# Patient Record
Sex: Male | Born: 1990 | Race: Black or African American | Hispanic: No | Marital: Single | Smoking: Former smoker
Health system: Southern US, Community
[De-identification: ages and names within clinical notes are randomized; demographics above are authoritative.]

## PROBLEM LIST (undated history)

## (undated) DIAGNOSIS — A039 Shigellosis, unspecified: Secondary | ICD-10-CM

## (undated) DIAGNOSIS — B20 Human immunodeficiency virus [HIV] disease: Secondary | ICD-10-CM

## (undated) DIAGNOSIS — F445 Conversion disorder with seizures or convulsions: Secondary | ICD-10-CM

## (undated) DIAGNOSIS — Z21 Asymptomatic human immunodeficiency virus [HIV] infection status: Secondary | ICD-10-CM

## (undated) DIAGNOSIS — F419 Anxiety disorder, unspecified: Secondary | ICD-10-CM

## (undated) HISTORY — DX: Human immunodeficiency virus (HIV) disease: B20

## (undated) HISTORY — DX: Asymptomatic human immunodeficiency virus (hiv) infection status: Z21

---
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
## 2013-02-07 ENCOUNTER — Emergency Department (HOSPITAL_COMMUNITY)
Admission: EM | Admit: 2013-02-07 | Discharge: 2013-02-07 | Disposition: A | Discharge status: Home / Self Care | Payer: BC Managed Care – PPO | Attending: Emergency Medicine | Admitting: Emergency Medicine

## 2013-02-07 ENCOUNTER — Encounter (HOSPITAL_COMMUNITY): Payer: Self-pay | Admitting: Emergency Medicine

## 2013-02-07 DIAGNOSIS — J069 Acute upper respiratory infection, unspecified: Secondary | ICD-10-CM | POA: Insufficient documentation

## 2013-02-07 DIAGNOSIS — F411 Generalized anxiety disorder: Secondary | ICD-10-CM | POA: Insufficient documentation

## 2013-02-07 DIAGNOSIS — Z8669 Personal history of other diseases of the nervous system and sense organs: Secondary | ICD-10-CM | POA: Insufficient documentation

## 2013-02-07 DIAGNOSIS — Z79899 Other long term (current) drug therapy: Secondary | ICD-10-CM | POA: Insufficient documentation

## 2013-02-07 DIAGNOSIS — H9209 Otalgia, unspecified ear: Secondary | ICD-10-CM | POA: Insufficient documentation

## 2013-02-07 DIAGNOSIS — F449 Dissociative and conversion disorder, unspecified: Secondary | ICD-10-CM | POA: Insufficient documentation

## 2013-02-07 NOTE — ED Provider Notes (Signed)
CSN: [id number]     Arrival date & time 02/07/13  1636 History  This chart was scribed for [name] Morn, NP working with Enid Skeens, MD by Carl Best, ED Scribe. This patient was seen in room TR07C/TR07C and the patient's care was started at 5:26 PM.     Chief Complaint  Patient presents with  . Sore Throat  . URI    Patient is a 22 y.o. male presenting with pharyngitis and URI. The history is provided by the patient. No language interpreter was used.  Sore Throat  URI Presenting symptoms: ear pain and sore throat   Presenting symptoms: no fever    HPI Comments: [name] [name] is a 22 y.o. male who presents to the Emergency Department complaining of constant sinus drainage, otalgia, and sore throat that started several days ago.  He denies fever, nausea, and emesis as associated symptoms.  He states that he has a history of strep throat.  The patient states that he took two doses of Dayquil yesterday with no relief.  The patient denies having a PCP but has insurance.    Past Medical History  Diagnosis Date  . Migraine   . Anxiety   . Psychiatric pseudoseizure    History reviewed. No pertinent past surgical history. History reviewed. No pertinent family history. History  Substance Use Topics  . Smoking status: Never Smoker   . Smokeless tobacco: Not on file  . Alcohol Use: No    Review of Systems  Constitutional: Negative for fever.  HENT: Positive for ear pain, postnasal drip and sore throat.   Gastrointestinal: Negative for nausea and vomiting.  All other systems reviewed and are negative.    Allergies  Review of patient's allergies indicates no known allergies.  Home Medications   Current Outpatient Rx  Name  Route  Sig  Dispense  Refill  . SUMAtriptan (IMITREX) 50 MG tablet   Oral   Take 1 tablet (50 mg total) by mouth every 2 (two) hours as needed. Migraines....   10 tablet   1    Triage Vitals: BP 134/96  Pulse 93  Temp(Src) 98.4 F (36.9 C) (Oral)   Resp 16  Wt 168 lb 14.4 oz (76.613 kg)  SpO2 99%  Physical Exam  Nursing note and vitals reviewed. Constitutional: He is oriented to person, place, and time. He appears well-developed and well-nourished. No distress.  HENT:  Head: Normocephalic and atraumatic.  Postnasal drip, oropharyngeal erythema, no tonsillar swelling or exudate  Eyes: EOM are normal.  Neck: Neck supple. No tracheal deviation present.  Cardiovascular: Normal rate.   Pulmonary/Chest: Effort normal. No respiratory distress.  Musculoskeletal: Normal range of motion.  Neurological: He is alert and oriented to person, place, and time.  Skin: Skin is warm and dry.  Psychiatric: He has a normal mood and affect. His behavior is normal.    ED Course  Procedures (including critical care time)  DIAGNOSTIC STUDIES: Oxygen Saturation is 99% on room air, normal by my interpretation.    COORDINATION OF CARE: 5:26 PM- Discussed the strep stain results with the patient which revealed a negative result.  Discussed waiting 48 hours for the strep culture results and advised the patient to obtain OTC medication and Ibuprofen or Tylenol to alleviate his symptoms.  Also advised the patient to contact his insurance company to obtain a PCP.  The patient agreed to the treatment plan.     Labs Review Labs Reviewed  RAPID STREP SCREEN  CULTURE, GROUP A  STREP   Imaging Review No results found.  EKG Interpretation   None       MDM   URI.   I personally performed the services described in this documentation, which was scribed in my presence. The recorded information has been reviewed and is accurate.    Jimmye Norman, NP 02/07/13 [phone]

## 2013-02-07 NOTE — ED Notes (Signed)
Productive cough, yellow sputum. Nasal congestion and sore throat since Sunday. "I usually get strep this time of year". No exudate noted in throat.

## 2013-02-07 NOTE — ED Notes (Signed)
Pt reports having URI, congestion, sore throat and runny nose for several days. Denies any fever. No acute distress noted.

## 2013-02-08 NOTE — ED Provider Notes (Signed)
Medical screening examination/treatment/procedure(s) were performed by non-physician practitioner and as supervising physician I was immediately available for consultation/collaboration.   Enid Skeens, MD 02/08/13 [phone]

## 2013-02-09 LAB — CULTURE, GROUP A STREP

## 2013-11-19 ENCOUNTER — Emergency Department (HOSPITAL_COMMUNITY): Payer: BC Managed Care – PPO

## 2013-11-19 ENCOUNTER — Inpatient Hospital Stay (HOSPITAL_COMMUNITY)
Admission: EM | Admit: 2013-11-19 | Discharge: 2013-11-21 | DRG: 977 | Disposition: A | Discharge status: Home / Self Care | Payer: BC Managed Care – PPO | Attending: Internal Medicine | Admitting: Internal Medicine

## 2013-11-19 ENCOUNTER — Encounter (HOSPITAL_COMMUNITY): Payer: Self-pay | Admitting: Emergency Medicine

## 2013-11-19 DIAGNOSIS — R509 Fever, unspecified: Secondary | ICD-10-CM | POA: Diagnosis present

## 2013-11-19 DIAGNOSIS — B20 Human immunodeficiency virus [HIV] disease: Principal | ICD-10-CM | POA: Diagnosis present

## 2013-11-19 DIAGNOSIS — F411 Generalized anxiety disorder: Secondary | ICD-10-CM | POA: Diagnosis present

## 2013-11-19 DIAGNOSIS — F17290 Nicotine dependence, other tobacco product, uncomplicated: Secondary | ICD-10-CM | POA: Diagnosis present

## 2013-11-19 DIAGNOSIS — M79609 Pain in unspecified limb: Secondary | ICD-10-CM | POA: Diagnosis present

## 2013-11-19 DIAGNOSIS — A419 Sepsis, unspecified organism: Secondary | ICD-10-CM

## 2013-11-19 DIAGNOSIS — F419 Anxiety disorder, unspecified: Secondary | ICD-10-CM | POA: Diagnosis present

## 2013-11-19 DIAGNOSIS — G43909 Migraine, unspecified, not intractable, without status migrainosus: Secondary | ICD-10-CM

## 2013-11-19 DIAGNOSIS — F445 Conversion disorder with seizures or convulsions: Secondary | ICD-10-CM | POA: Diagnosis present

## 2013-11-19 DIAGNOSIS — Z8669 Personal history of other diseases of the nervous system and sense organs: Secondary | ICD-10-CM

## 2013-11-19 DIAGNOSIS — D696 Thrombocytopenia, unspecified: Secondary | ICD-10-CM | POA: Diagnosis present

## 2013-11-19 DIAGNOSIS — R569 Unspecified convulsions: Secondary | ICD-10-CM | POA: Diagnosis present

## 2013-11-19 DIAGNOSIS — A039 Shigellosis, unspecified: Secondary | ICD-10-CM | POA: Diagnosis present

## 2013-11-19 DIAGNOSIS — F17200 Nicotine dependence, unspecified, uncomplicated: Secondary | ICD-10-CM | POA: Diagnosis present

## 2013-11-19 HISTORY — DX: Shigellosis, unspecified: A03.9

## 2013-11-19 LAB — CSF CELL COUNT WITH DIFFERENTIAL
RBC Count, CSF: 1 /mm3 — ABNORMAL HIGH
RBC Count, CSF: 128 /mm3 — ABNORMAL HIGH
Tube #: 1
Tube #: 4
WBC CSF: 3 /mm3 (ref 0–5)
WBC, CSF: 4 /mm3 (ref 0–5)

## 2013-11-19 LAB — COMPREHENSIVE METABOLIC PANEL
ALT: 21 U/L (ref 0–53)
AST: 29 U/L (ref 0–37)
Albumin: 4.1 g/dL (ref 3.5–5.2)
Alkaline Phosphatase: 70 U/L (ref 39–117)
Anion gap: 16 — ABNORMAL HIGH (ref 5–15)
BUN: 8 mg/dL (ref 6–23)
CHLORIDE: 99 meq/L (ref 96–112)
CO2: 21 mEq/L (ref 19–32)
Calcium: 9.4 mg/dL (ref 8.4–10.5)
Creatinine, Ser: 1.06 mg/dL (ref 0.50–1.35)
GFR calc Af Amer: >90 mL/min (ref >90)
GFR calc non Af Amer: >90 mL/min (ref >90)
Glucose, Bld: 106 mg/dL — ABNORMAL HIGH (ref 70–99)
Potassium: 4.4 mEq/L (ref 3.7–5.3)
SODIUM: 136 meq/L — AB (ref 137–147)
TOTAL PROTEIN: 8.2 g/dL (ref 6.0–8.3)
Total Bilirubin: 0.8 mg/dL (ref 0.3–1.2)

## 2013-11-19 LAB — CBC WITH DIFFERENTIAL/PLATELET
BASOS PCT: 0 % (ref 0–1)
Basophils Absolute: 0 10*3/uL (ref 0.0–0.1)
EOS ABS: 0 10*3/uL (ref 0.0–0.7)
Eosinophils Relative: 0 % (ref 0–5)
HCT: 43.1 % (ref 39.0–52.0)
Hemoglobin: 14.9 g/dL (ref 13.0–17.0)
Lymphocytes Relative: 10 % — ABNORMAL LOW (ref 12–46)
Lymphs Abs: 1.2 10*3/uL (ref 0.7–4.0)
MCH: 31.9 pg (ref 26.0–34.0)
MCHC: 34.6 g/dL (ref 30.0–36.0)
MCV: 92.3 fL (ref 78.0–100.0)
Monocytes Absolute: 1.1 10*3/uL — ABNORMAL HIGH (ref 0.1–1.0)
Monocytes Relative: 9 % (ref 3–12)
Neutro Abs: 9.8 10*3/uL — ABNORMAL HIGH (ref 1.7–7.7)
Neutrophils Relative %: 82 % — ABNORMAL HIGH (ref 43–77)
PLATELETS: 102 10*3/uL — AB (ref 150–400)
RBC: 4.67 MIL/uL (ref 4.22–5.81)
RDW: 12.4 % (ref 11.5–15.5)
WBC: 12 10*3/uL — ABNORMAL HIGH (ref 4.0–10.5)

## 2013-11-19 LAB — RAPID URINE DRUG SCREEN, HOSP PERFORMED
Amphetamines: NOT DETECTED
BARBITURATES: NOT DETECTED
Benzodiazepines: NOT DETECTED
Cocaine: NOT DETECTED
Opiates: NOT DETECTED
TETRAHYDROCANNABINOL: NOT DETECTED

## 2013-11-19 LAB — RAPID STREP SCREEN (MED CTR MEBANE ONLY): STREPTOCOCCUS, GROUP A SCREEN (DIRECT): NEGATIVE

## 2013-11-19 LAB — CREATININE, SERUM
Creatinine, Ser: 0.92 mg/dL (ref 0.50–1.35)
GFR calc Af Amer: >90 mL/min (ref >90)
GFR calc non Af Amer: >90 mL/min (ref >90)

## 2013-11-19 LAB — URINALYSIS, ROUTINE W REFLEX MICROSCOPIC
Bilirubin Urine: NEGATIVE
Glucose, UA: NEGATIVE mg/dL
Ketones, ur: NEGATIVE mg/dL
Leukocytes, UA: NEGATIVE
Nitrite: NEGATIVE
PH: 7.5 (ref 5.0–8.0)
Protein, ur: NEGATIVE mg/dL
SPECIFIC GRAVITY, URINE: 1.01 (ref 1.005–1.030)
UROBILINOGEN UA: 0.2 mg/dL (ref 0.0–1.0)

## 2013-11-19 LAB — GRAM STAIN

## 2013-11-19 LAB — CBC
HCT: 42.3 % (ref 39.0–52.0)
HEMOGLOBIN: 14.4 g/dL (ref 13.0–17.0)
MCH: 32.4 pg (ref 26.0–34.0)
MCHC: 34 g/dL (ref 30.0–36.0)
MCV: 95.3 fL (ref 78.0–100.0)
Platelets: 105 10*3/uL — ABNORMAL LOW (ref 150–400)
RBC: 4.44 MIL/uL (ref 4.22–5.81)
RDW: 12.6 % (ref 11.5–15.5)
WBC: 11.5 10*3/uL — ABNORMAL HIGH (ref 4.0–10.5)

## 2013-11-19 LAB — HEMOGLOBIN A1C
Hgb A1c MFr Bld: 5.1 % (ref <5.7)
Mean Plasma Glucose: 100 mg/dL (ref <117)

## 2013-11-19 LAB — PROTEIN, CSF: TOTAL PROTEIN, CSF: 14 mg/dL — AB (ref 15–45)

## 2013-11-19 LAB — INFLUENZA PANEL BY PCR (TYPE A & B)
H1N1FLUPCR: NOT DETECTED
Influenza A By PCR: NEGATIVE
Influenza B By PCR: NEGATIVE

## 2013-11-19 LAB — RPR

## 2013-11-19 LAB — HIV 1/2 CONFIRMATION
HIV 1 ANTIBODY: POSITIVE — AB
HIV 2 AB: NEGATIVE

## 2013-11-19 LAB — CRYPTOCOCCAL ANTIGEN, CSF: Crypto Ag: NEGATIVE

## 2013-11-19 LAB — TSH: TSH: 0.58 u[IU]/mL (ref 0.350–4.500)

## 2013-11-19 LAB — GLUCOSE, CSF: Glucose, CSF: 79 mg/dL — ABNORMAL HIGH (ref 43–76)

## 2013-11-19 LAB — URINE MICROSCOPIC-ADD ON

## 2013-11-19 LAB — HIV ANTIBODY (ROUTINE TESTING W REFLEX): HIV 1&2 Ab, 4th Generation: REACTIVE — AB

## 2013-11-19 LAB — I-STAT CG4 LACTIC ACID, ED
Lactic Acid, Venous: 3.2 mmol/L — ABNORMAL HIGH (ref 0.5–2.2)
Lactic Acid, Venous: 1.12 mmol/L (ref 0.5–2.2)

## 2013-11-19 MED ORDER — DEXAMETHASONE SODIUM PHOSPHATE 4 MG/ML IJ SOLN
10.0000 mg | Freq: Once | INTRAMUSCULAR | Status: AC
  Administered 2013-11-19: 10 mg via INTRAVENOUS
  Filled 2013-11-19: qty 3

## 2013-11-19 MED ORDER — DEXTROSE 5 % IV SOLN
2.0000 g | Freq: Once | INTRAVENOUS | Status: AC
  Administered 2013-11-19: 2 g via INTRAVENOUS
  Filled 2013-11-19: qty 2

## 2013-11-19 MED ORDER — SODIUM CHLORIDE 0.9 % IV BOLUS (SEPSIS)
1000.0000 mL | Freq: Once | INTRAVENOUS | Status: AC
  Administered 2013-11-19: 1000 mL via INTRAVENOUS

## 2013-11-19 MED ORDER — SODIUM CHLORIDE 0.9 % IJ SOLN
3.0000 mL | Freq: Two times a day (BID) | INTRAMUSCULAR | Status: DC
  Administered 2013-11-20 – 2013-11-21 (×2): 3 mL via INTRAVENOUS

## 2013-11-19 MED ORDER — DEXAMETHASONE SODIUM PHOSPHATE 10 MG/ML IJ SOLN: 10.0000 mg | Freq: Once | INTRAMUSCULAR | Status: DC

## 2013-11-19 MED ORDER — ACETAMINOPHEN 650 MG RE SUPP: 650.0000 mg | Freq: Four times a day (QID) | RECTAL | Status: DC | PRN

## 2013-11-19 MED ORDER — HYDROMORPHONE HCL PF 1 MG/ML IJ SOLN
1.0000 mg | Freq: Once | INTRAMUSCULAR | Status: DC
  Filled 2013-11-19: qty 1

## 2013-11-19 MED ORDER — DEXTROSE 5 % IV SOLN
2.0000 g | Freq: Two times a day (BID) | INTRAVENOUS | Status: DC
  Administered 2013-11-20 (×3): 2 g via INTRAVENOUS
  Filled 2013-11-19 (×6): qty 2

## 2013-11-19 MED ORDER — HYDROCODONE-ACETAMINOPHEN 5-325 MG PO TABS
1.0000 | ORAL_TABLET | ORAL | Status: DC | PRN
  Administered 2013-11-19 – 2013-11-21 (×3): 2 via ORAL
  Filled 2013-11-19 (×4): qty 2

## 2013-11-19 MED ORDER — SODIUM CHLORIDE 0.9 % IV SOLN
2.0000 g | Freq: Once | INTRAVENOUS | Status: AC
  Administered 2013-11-19: 2 g via INTRAVENOUS
  Filled 2013-11-19: qty 2000

## 2013-11-19 MED ORDER — ONDANSETRON HCL 4 MG/2ML IJ SOLN
4.0000 mg | Freq: Four times a day (QID) | INTRAMUSCULAR | Status: DC | PRN
Start: 2013-11-19 — End: 2013-11-21

## 2013-11-19 MED ORDER — VANCOMYCIN HCL IN DEXTROSE 1-5 GM/200ML-% IV SOLN: 1000.0000 mg | Freq: Once | INTRAVENOUS | Status: DC

## 2013-11-19 MED ORDER — DEXTROSE 5 % IV SOLN
10.0000 mg/kg | Freq: Three times a day (TID) | INTRAVENOUS | Status: DC
  Administered 2013-11-19 – 2013-11-20 (×2): 725 mg via INTRAVENOUS
  Filled 2013-11-19 (×4): qty 14.5

## 2013-11-19 MED ORDER — SODIUM CHLORIDE 0.9 % IV SOLN
2.0000 g | INTRAVENOUS | Status: DC
  Administered 2013-11-19: 2 g via INTRAVENOUS
  Filled 2013-11-19 (×6): qty 2000

## 2013-11-19 MED ORDER — ENOXAPARIN SODIUM 40 MG/0.4ML ~~LOC~~ SOLN
40.0000 mg | SUBCUTANEOUS | Status: DC
Start: 2013-11-19 — End: 2013-11-21
  Administered 2013-11-19: 40 mg via SUBCUTANEOUS
  Filled 2013-11-19 (×2): qty 0.4

## 2013-11-19 MED ORDER — ONDANSETRON HCL 4 MG PO TABS: 4.0000 mg | ORAL_TABLET | Freq: Four times a day (QID) | ORAL | Status: DC | PRN

## 2013-11-19 MED ORDER — DEXTROSE 5 % IV SOLN
10.0000 mg/kg | Freq: Once | INTRAVENOUS | Status: AC
  Administered 2013-11-19: 725 mg via INTRAVENOUS
  Filled 2013-11-19: qty 14.5

## 2013-11-19 MED ORDER — VANCOMYCIN HCL IN DEXTROSE 1-5 GM/200ML-% IV SOLN
1000.0000 mg | Freq: Three times a day (TID) | INTRAVENOUS | Status: DC
  Administered 2013-11-19 – 2013-11-21 (×5): 1000 mg via INTRAVENOUS
  Filled 2013-11-19 (×9): qty 200

## 2013-11-19 MED ORDER — ACETAMINOPHEN 325 MG PO TABS
650.0000 mg | ORAL_TABLET | Freq: Once | ORAL | Status: AC
  Administered 2013-11-19: 650 mg via ORAL
  Filled 2013-11-19: qty 2

## 2013-11-19 MED ORDER — SODIUM CHLORIDE 0.9 % IV SOLN
1000.0000 mL | INTRAVENOUS | Status: DC
  Administered 2013-11-19 – 2013-11-20 (×3): 1000 mL via INTRAVENOUS

## 2013-11-19 MED ORDER — LORAZEPAM 2 MG/ML IJ SOLN
1.0000 mg | Freq: Once | INTRAMUSCULAR | Status: AC
  Administered 2013-11-19: 1 mg via INTRAVENOUS
  Filled 2013-11-19: qty 1

## 2013-11-19 MED ORDER — SODIUM CHLORIDE 0.9 % IV BOLUS (SEPSIS)
30.0000 mL/kg | Freq: Once | INTRAVENOUS | Status: AC
  Administered 2013-11-19: 2178 mL via INTRAVENOUS

## 2013-11-19 MED ORDER — VANCOMYCIN HCL 10 G IV SOLR
1500.0000 mg | Freq: Once | INTRAVENOUS | Status: AC
  Administered 2013-11-19: 1500 mg via INTRAVENOUS
  Filled 2013-11-19: qty 1500

## 2013-11-19 MED ORDER — ACETAMINOPHEN 325 MG PO TABS: 650.0000 mg | ORAL_TABLET | Freq: Four times a day (QID) | ORAL | Status: DC | PRN

## 2013-11-19 NOTE — ED Notes (Signed)
Lactic Acid 1.12 mmol/L given to PA Piepenbrink

## 2013-11-19 NOTE — ED Notes (Signed)
PA at bedside starting the LP.

## 2013-11-19 NOTE — ED Notes (Signed)
LP procedure beginning. Time out completed.

## 2013-11-19 NOTE — Progress Notes (Signed)
[name] [name] Kress [id number] Admission Data: 11/19/2013 4:54 PM Attending Provider: Aletta EdouardShilpa Bhardwaj, MD  PCP:No PCP Per Patient Consults/ Treatment Team:    [name] [name] [name] [name] is a 23 y.o. male patient admitted from ED awake, alert  & orientated  X 3,  Full Code, VSS - Blood pressure 104/52, pulse 120, temperature 100 F (37.8 C), temperature source Oral, resp. rate 22, height 5\' 7"  (1.702 m), weight 80.559 kg (177 lb 9.6 oz), SpO2 100.00%., no c/o shortness of breath, no c/o chest pain, no distress noted. Tele # 06 placed.  IV site WDL: Right A/C running NS.   Allergies:  No Known Allergies   Past Medical History  Diagnosis Date  . Migraine   . Anxiety   . Psychiatric pseudoseizure     Pt orientation to unit, room and routine. Information packet given to patient/family.  Admission INP armband ID verified with patient/family, and in place. SR up x 2, fall risk assessment complete with Patient and family verbalizing understanding of risks associated with falls. Pt verbalizes an understanding of how to use the call bell and to call for help before getting out of bed.  Skin, clean-dry- intact without evidence of bruising, or skin tears.   No evidence of skin break down noted on exam.    Will cont to monitor and assist as needed.  Kern ReapBrumagin, Sultan Pargas L, RN 11/19/2013 4:54 PM

## 2013-11-19 NOTE — ED Notes (Signed)
Pt in CT.

## 2013-11-19 NOTE — H&P (Signed)
Date: 11/19/2013               [name] [name] Name:  [name] [name] MRN: [id number]  DOB: [birth year] Age / Sex: 23 y.o., male   PCP: No Pcp Per [name] [name]         Medical Service: Internal Medicine Teaching Service         Attending Physician: Dr. Aletta Edouard, MD    First Contact: Camila Li Pager: [phone]  Second Contact: Dr. Christen Bame Pager: [phone]   Third Contact: Dr. Rich Number Pager: [phone]  After Hours (After 5p/  First Contact Pager: [phone]  weekends / holidays): Second Contact Pager: [phone]   Chief Complaint: Nausea, vomiting, diarrhea, leg pain and weakness  History of Present Illness: [name] [name] is a 23yo man with PMHx of migraines, pseudoseizures, and anxiety who presents to the ED with nausea, vomiting, diarrhea and severe leg pain and weakness that started last night. [name] [name] has chronic mild baseline leg weakness and pain but says over the past few days it has increased significantly. Last night [name] [name] reports having a subjective fever with shaking and chills. He had severe leg pain from his hips down to his feet.  [name] [name] describes having "too many BMs to count" last night and feeling so bad he had to "sleep in the bathroom". He has not taken any over the counter medications for his symptoms.  His nausea has worsened today and he had one episode of emesis in the ambulance on his way to the hospital. He currently endorses having headaches that are throbbing at times and similar to migraines he has had in the past. He endorses having nuchal rigidity that is worse today than yesterday.  [name] [name] denies any recent travel, new rashes, illness, or spending time in the woods. 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 He is sexually active with both males and females and states that he always uses protection.   On presentation to the ED, the [name] [name] had a fever of 102 with tachycardia and tachypnea. His BP was in the 80s/40s, he had a WBC of 12, lactate  of 3.2 and nuchal rigidity. He received 2L bolus of NS and was started on empiric vancomycin, acyclovir, rocephin, and ampicillin. He also received 1 times does of dexamethasone. Lumbar puncture was performed showing clear fluid with protein of 14, glucose of 79, occasional leukocytes, and negative for gram stain/Cryptococcal antigens. Blood cultures were drawn. Chest x-ray was normal with CT showing multilevel paranasal sinus disease, but otherwise normal.   Meds: Current Facility-Administered Medications  Medication Dose Route Frequency Provider Last Rate Last Dose  . 0.9 %  sodium chloride infusion  1,000 mL Intravenous Continuous Jennifer L Piepenbrink, PA-C 125 mL/hr at 11/19/13 1152 1,000 mL at 11/19/13 1152  . acetaminophen (TYLENOL) tablet 650 mg  650 mg Oral Q6H PRN Christen Bame, MD       Or  . acetaminophen (TYLENOL) suppository 650 mg  650 mg Rectal Q6H PRN Christen Bame, MD      . acyclovir (ZOVIRAX) 725 mg in dextrose 5 % 150 mL IVPB  10 mg/kg Intravenous 3 times per day Renaee Munda, RPH      . cefTRIAXone (ROCEPHIN) 2 g in dextrose 5 % 50 mL IVPB  2 g Intravenous Q12H Kendra P Hiatt, RPH      . enoxaparin (LOVENOX) injection 40 mg  40 mg Subcutaneous Q24H Christen Bame, MD      .  HYDROcodone-acetaminophen (NORCO/VICODIN) 5-325 MG per tablet 1-2 tablet  1-2 tablet Oral Q4H PRN Christen Bame, MD   2 tablet at 11/19/13 1710  . HYDROmorphone (DILAUDID) injection 1 mg  1 mg Intravenous Once Jennifer L Piepenbrink, PA-C      . ondansetron (ZOFRAN) tablet 4 mg  4 mg Oral Q6H PRN Christen Bame, MD       Or  . ondansetron [facility]) injection 4 mg  4 mg Intravenous Q6H PRN Christen Bame, MD      . sodium chloride 0.9 % injection 3 mL  3 mL Intravenous Q12H Christen Bame, MD      . vancomycin (VANCOCIN) IVPB 1000 mg/200 mL premix  1,000 mg Intravenous Q8H Kendra P Hiatt, RPH        Allergies: Allergies as of 11/19/2013  . (No Known Allergies)   Past Medical History  Diagnosis Date  . Migraine   .  Anxiety   . Psychiatric pseudoseizure    History reviewed. No pertinent past surgical history. No family history on file. History   Social History  . Marital Status: Single    Spouse Name: N/A    Number of Children: N/A  . Years of Education: N/A   Occupational History  . Not on file.   Social History Main Topics  . Smoking status: Never Smoker   . Smokeless tobacco: Not on file  . Alcohol Use: Yes  . Drug Use: Not on file  . Sexual Activity: Not on file   Other Topics Concern  . Not on file   Social History Narrative   [name] [name] lives with a roommate in [place] where he attends [employer] and is currently enrolled in summer school. He is originally from [place]. He is sexual active with both men and women and does use condoms for protection. He endorses having an occasional glass of whine (no more than a couple drinks a week). He smokes a cigar 2-3 times a week but denies smoking cigarettes. He has smoked marijuana in the past but non recently.     Review of Systems: General: Denies recent weight loss HEENT: Reports headaches as above. No ear pain, rhinorrhea, sore throat CV: Denies palpitations, chest pain Pulm: Reports mild shortness of breath. Denies wheezing coughing GI: No constipation, melena or hematochezia GU: Denies hematuria, frequency, urgency MSK: Denis joint pains Skin: Denies recent rashes or bruises   Physical Exam: Blood pressure 104/52, pulse 120, temperature 100 F (37.8 C), temperature source Oral, resp. rate 22, height 5\' 7"  (1.702 m), weight 177 lb 9.6 oz (80.559 kg), SpO2 100.00%. General: Sick appearing, lying in bed HEENT: [place]/AT, PERRLA, moist mucus membranes, shortened frenulum, nuchal rigidity Lungs: Clear to auscultation bilaterally, breaths nonlabored on 2L O2 via [place] Heart: Tachycardia with regular rhythm, normal S1 and S2 with no murmurs Abdomen: +BS, non-tender, non-distended Extremities: Pain to palpation of quadriceps bilaterally, no  joint warmness/effusion, pulse 2+ bilaterally Skin: No new rashes or ulcers Neurologic: A&Ox3, attentive to conversation, no focal deficits  Lab results: Basic Metabolic Panel:  Recent Labs  16/10/96 1050  NA 136*  K 4.4  CL 99  CO2 21  GLUCOSE 106*  BUN 8  CREATININE 1.06  CALCIUM 9.4   Liver Function Tests:  Recent Labs  11/19/13 1050  AST 29  ALT 21  ALKPHOS 70  BILITOT 0.8  PROT 8.2  ALBUMIN 4.1   No results found for this basename: LIPASE, AMYLASE,  in the last 72 hours No results found for  this basename: AMMONIA,  in the last 72 hours CBC:  Recent Labs  11/19/13 1050 11/19/13 1704  WBC 12.0* 11.5*  NEUTROABS 9.8*  --   HGB 14.9 14.4  HCT 43.1 42.3  MCV 92.3 95.3  PLT 102* 105*   Cardiac Enzymes: No results found for this basename: CKTOTAL, CKMB, CKMBINDEX, TROPONINI,  in the last 72 hours BNP: No results found for this basename: PROBNP,  in the last 72 hours D-Dimer: No results found for this basename: DDIMER,  in the last 72 hours CBG: No results found for this basename: GLUCAP,  in the last 72 hours Hemoglobin A1C: No results found for this basename: HGBA1C,  in the last 72 hours Fasting Lipid Panel: No results found for this basename: CHOL, HDL, LDLCALC, TRIG, CHOLHDL, LDLDIRECT,  in the last 72 hours Thyroid Function Tests: No results found for this basename: TSH, T4TOTAL, FREET4, T3FREE, THYROIDAB,  in the last 72 hours Anemia Panel: No results found for this basename: VITAMINB12, FOLATE, FERRITIN, TIBC, IRON, RETICCTPCT,  in the last 72 hours Coagulation: No results found for this basename: LABPROT, INR,  in the last 72 hours Urine Drug Screen: Drugs of Abuse  No results found for this basename: labopia,  cocainscrnur,  labbenz,  amphetmu,  thcu,  labbarb    Alcohol Level: No results found for this basename: ETH,  in the last 72 hours Urinalysis:  Recent Labs  11/19/13 1358  COLORURINE YELLOW  LABSPEC 1.010  PHURINE 7.5    GLUCOSEU NEGATIVE  HGBUR MODERATE*  BILIRUBINUR NEGATIVE  KETONESUR NEGATIVE  PROTEINUR NEGATIVE  UROBILINOGEN 0.2  NITRITE NEGATIVE  LEUKOCYTESUR NEGATIVE    Imaging results:  Ct Head Wo Contrast  11/19/2013   CLINICAL DATA:  Headache and nuchal rigidity  EXAM: CT HEAD WITHOUT CONTRAST  TECHNIQUE: Contiguous axial images were obtained from the base of the skull through the vertex without intravenous contrast.  COMPARISON:  None.  FINDINGS: The ventricles are normal in size and configuration. There is no mass, hemorrhage, extra-axial fluid collection, or midline shift. Gray-white compartments appear normal. No acute infarct is apparent. No meningeal lesions are identified on this noncontrast enhanced study.  Bony calvarium appears intact. The mastoid air cells are clear. There is mucosal thickening in both maxillary antra as well as in multiple ethmoid air cells, more on the right than on the left. Mild mucosal thickening is also noted in the sphenoid sinus regions. Frontal sinuses are hypoplastic.  IMPRESSION: Multilevel paranasal sinus disease.  Study otherwise unremarkable.   Electronically Signed   By: Bretta Bang M.D.   On: 11/19/2013 11:38   Dg Chest Port 1 View  11/19/2013   CLINICAL DATA:  MIGRAINE LEG PAIN  EXAM: PORTABLE CHEST - 1 VIEW  COMPARISON:  None.  FINDINGS: Relatively low lung volumes. Lungs clear. Heart size upper limits normal. No effusion. Regional bones unremarkable.  IMPRESSION: No active disease.   Electronically Signed   By: Oley Balm M.D.   On: 11/19/2013 11:14    Other results: EKG: Pending  Assessment & Plan: [name] [name] is a 23 yo man with PMH of migraines, anxiety and pseudo seizures who presented with nausea, vomiting, diarrhea, nuchal rigidity and was found to have sepsis consistent with likely viral or bacterial infection.  1. Sepsis concerning for meningitis or other infection- On presentation [name] [name] had a fever of 102.8, was tachycardic to 127  , tachypnic to 27, hypotensive to 80s/40s with a WBC of 12. Lactic acid 3.2. Nuchal rigidity  with associated headache is concerning for meningitis. Lumbar puncture showed normal glucose, low protein, with normal gram stain and was negative for cryptococcal antigen. Viral studies including HSV are still pending. [name] [name] peri-nasal sinus disease could have provided source for infection although [name] [name] denies recent congestion. The differential also includes systemic infection, such as HIV as [name] [name] has multiple sexual partners of both genders. His absolute lymphocyte count of 1.2 is in the low normal range. Sepsis could also be secondary to bacteremia. Urinary source of infection is unlikely given demographic and bland urinalysis except for moderate red blood cells. Gonococcal arthritis is less likely given lack of specific joint pain or rash. - Admit to general floor  - Continue NS at 125 ml/hr - f/u blood cultures, HSV viral culture  - Continue prophylactic vancomycin, acyclovir, Rocephin - d/c ampicillin as already has 3rd generation cephalosporin - f/u CSF viral panel, culture, gram stain (negative) - Zofran PRN for nausea - f/u influenza panel --> negative  - Continuous cardiac monitoring - f/u rapid drug screen - f/u Cryptococcal Ag--> negative - f/u HIV antibody - f/u rapid strep screen --> negative  2. Migraine-History of migraines in the past that were treated by a drug [name] [name] can't remember which was stopped when symptoms became intermittent. Current migraines likely exacerbated by infection.  - Continue to monitor - Norco q 4 hours for pain.   Diet: Regular  DVT prophylaxis: Lovenox 40 mg BID Droplet precautions  Dispo: Disposition is deferred at this time, awaiting improvement of current medical problems. Anticipated discharge in approximately 2-3 day(s).   The [name] [name] does not have a current PCP (No Pcp Per [name] [name]) and [facility] after  discharge.  The [name] [name] does not have transportation limitations that hinder transportation to clinic appointments.  Signed: Rich Numberarly Kobe Jansma, MD 11/19/2013, 5:48 PM

## 2013-11-19 NOTE — ED Notes (Signed)
Pt states his headache is better and he is resting comfortably.

## 2013-11-19 NOTE — ED Notes (Signed)
Patient transported to CT 

## 2013-11-19 NOTE — ED Notes (Signed)
Obtained consent for LP. Pt agreeable to procedure and understands risks/benefits

## 2013-11-19 NOTE — Progress Notes (Signed)
ANTIBIOTIC CONSULT NOTE - INITIAL  Pharmacy Consult for Vancomycin, Rocephin, Ampicillin, Acylocir Indication: R/O meningitis  No Known Allergies  Patient Measurements: Height: 5\' 6"  (167.6 cm) Weight: 160 lb (72.576 kg) IBW/kg (Calculated) : 63.8 Adjusted Body Weight:   Vital Signs: Temp: 102.8 F (39.3 C) (07/26 1028) Temp src: Oral (07/26 1028) BP: 96/40 mmHg (07/26 1232) Pulse Rate: 119 (07/26 1200) Intake/Output from previous day:   Intake/Output from this shift:    Labs:  Recent Labs  11/19/13 1050  WBC 12.0*  HGB 14.9  PLT 102*  CREATININE 1.06   Estimated Creatinine Clearance: 99.5 ml/min (by C-G formula based on Cr of 1.06). No results found for this basename: VANCOTROUGH, VANCOPEAK, VANCORANDOM, GENTTROUGH, GENTPEAK, GENTRANDOM, TOBRATROUGH, TOBRAPEAK, TOBRARND, AMIKACINPEAK, AMIKACINTROU, AMIKACIN,  in the last 72 hours   Microbiology: No results found for this or any previous visit (from the past 720 hour(s)).  Medical History: Past Medical History  Diagnosis Date  . Migraine   . Anxiety   . Psychiatric pseudoseizure     Medications:  Scheduled:  .  HYDROmorphone (DILAUDID) injection  1 mg Intravenous Once   Assessment: 23yo male presenting with c/o migraine and stiff neck, with temperature 102.8.  To start antibiotics for R/O meningitis.  Cr 1.06, LA 3.2, WBC 12.  Ampicillin 2g and Ceftriaxone 2g currently infusing.  Goal of Therapy:  Vancomycin trough level 15-20 mcg/ml treatment of meningitis  Plan:  1-  Vancomycin 1500mg  IV x 1, then 1000mg  IV q8 2-  Acyclovir 10mg /kg IV q8 3-  Rocephin 2g IV q12 4-  Ampicillin 2g IV q4 5-  F/U LP, trend renal fxn and clinical status 6-  Vancomycin trough at steady state  Brooklee Michelin P 11/19/2013,12:33 PM

## 2013-11-19 NOTE — ED Notes (Signed)
LP completed and successful. Pt tolerated well. Pt laying on back from 30 minutes

## 2013-11-19 NOTE — ED Provider Notes (Signed)
CSN: [id number]     Arrival date & time 11/19/13  1022 History   First MD Initiated Contact with Patient 11/19/13 1029     Chief Complaint  Patient presents with  . Migraine  . Leg Pain     (Consider location/radiation/quality/duration/timing/severity/associated sxs/prior Treatment) HPI Comments: Patient is a 23 year old male past medical history significant for migraines, anxiety, pseudoseizures presenting to the emergency department for headache with associated fever, chills, neck pain, fatigue that began last evening. Alleviating factors: none. Aggravating factors: movement of his neck. Medications tried prior to arrival: none. Denies CP, SOB, cough, nausea, vomiting, diarrhea.     Patient is a 23 y.o. male presenting with migraines and leg pain.  Migraine Associated symptoms include chills, fatigue, a fever, headaches and neck pain.  Leg Pain Associated symptoms: fatigue, fever and neck pain     Past Medical History  Diagnosis Date  . Migraine   . Anxiety   . Psychiatric pseudoseizure    History reviewed. No pertinent past surgical history. No family history on file. History  Substance Use Topics  . Smoking status: Never Smoker   . Smokeless tobacco: Not on file  . Alcohol Use: Yes    Review of Systems  Constitutional: Positive for fever, chills and fatigue.  Musculoskeletal: Positive for neck pain and neck stiffness.  Neurological: Positive for headaches.  All other systems reviewed and are negative.     Allergies  Review of patient's allergies indicates no known allergies.  Home Medications   Prior to Admission medications   Not on File   BP 93/53  Pulse 128  Temp(Src) 101 F (38.3 C) (Oral)  Resp 25  Ht 5\' 6"  (1.676 m)  Wt 160 lb (72.576 kg)  BMI 25.84 kg/m2  SpO2 97% Physical Exam  Nursing note and vitals reviewed. Constitutional: He is oriented to person, place, and time. He appears well-developed and well-nourished. He appears ill.  HENT:   Head: Normocephalic and atraumatic.  Right Ear: External ear normal.  Left Ear: External ear normal.  Nose: Nose normal.  Mouth/Throat: Oropharynx is clear and moist. No oropharyngeal exudate.  Eyes: Conjunctivae and EOM are normal. Pupils are equal, round, and reactive to light.  Neck: Muscular tenderness present. Decreased range of motion present. No edema present. Kernig's sign noted. No Brudzinski's sign noted.  Cardiovascular: Regular rhythm and normal heart sounds.  Tachycardia present.   Pulmonary/Chest: Effort normal and breath sounds normal. No respiratory distress.  Abdominal: Soft. There is no tenderness.  Musculoskeletal: He exhibits no edema.  MAE x 4  Neurological: He is alert and oriented to person, place, and time. He has normal strength. No cranial nerve deficit. GCS eye subscore is 4. GCS verbal subscore is 5. GCS motor subscore is 6.  Sensation grossly intact.   Skin: Skin is warm and dry. No rash noted.    ED Course  LUMBAR PUNCTURE Date/Time: 11/19/2013 12:26 PM Performed by: Jeannetta EllisPIEPENBRINK, Miriam Kestler L Authorized by: Jeannetta EllisPIEPENBRINK, Pasty Manninen L Consent: Verbal consent obtained. written consent obtained. Risks and benefits: risks, benefits and alternatives were discussed Consent given by: patient Patient understanding: patient states understanding of the procedure being performed Patient consent: the patient's understanding of the procedure matches consent given Procedure consent: procedure consent matches procedure scheduled Relevant documents: relevant documents present and verified Test results: test results available and properly labeled Imaging studies: imaging studies available Patient identity confirmed: verbally with patient, arm band and hospital-assigned identification number Time out: Immediately prior to procedure a "time out" was  called to verify the correct patient, procedure, equipment, support staff and site/side marked as required. Indications:  evaluation for infection Local anesthetic: lidocaine 2% without epinephrine Anesthetic total: 5 ml Patient sedated: no Preparation: Patient was prepped and draped in the usual sterile fashion. Lumbar space: L3-L4 interspace Patient's position: right lateral decubitus Needle gauge: 18 Needle type: spinal needle - Quincke tip Needle length: 2.5 in Number of attempts: 3 Fluid appearance: clear Tubes of fluid: 4 Total volume: 4 ml Post-procedure: site cleaned, pressure dressing applied and adhesive bandage applied Patient tolerance: Patient tolerated the procedure well with no immediate complications.   (including critical care time) Medications  sodium chloride 0.9 % bolus 2,178 mL (0 mLs Intravenous Stopped 11/19/13 1239)    Followed by  0.9 %  sodium chloride infusion (1,000 mLs Intravenous New Bag/Given 11/19/13 1152)  HYDROmorphone (DILAUDID) injection 1 mg (0 mg Intravenous Hold 11/19/13 1158)  cefTRIAXone (ROCEPHIN) 2 g in dextrose 5 % 50 mL IVPB (not administered)  ampicillin (OMNIPEN) 2 g in sodium chloride 0.9 % 50 mL IVPB (not administered)  acyclovir (ZOVIRAX) 725 mg in dextrose 5 % 150 mL IVPB (not administered)  vancomycin (VANCOCIN) IVPB 1000 mg/200 mL premix (not administered)  cefTRIAXone (ROCEPHIN) 2 g in dextrose 5 % 50 mL IVPB (0 g Intravenous Stopped 11/19/13 1227)  ampicillin (OMNIPEN) 2 g in sodium chloride 0.9 % 50 mL IVPB (0 g Intravenous Stopped 11/19/13 1225)  acetaminophen (TYLENOL) tablet 650 mg (650 mg Oral Given 11/19/13 1106)  vancomycin (VANCOCIN) 1,500 mg in sodium chloride 0.9 % 500 mL IVPB (1,500 mg Intravenous New Bag/Given 11/19/13 1244)  acyclovir (ZOVIRAX) 725 mg in dextrose 5 % 150 mL IVPB (0 mg Intravenous Stopped 11/19/13 1353)  LORazepam (ATIVAN) injection 1 mg (1 mg Intravenous Given 11/19/13 1157)  dexamethasone (DECADRON) injection 10 mg (10 mg Intravenous Given 11/19/13 1154)  sodium chloride 0.9 % bolus 1,000 mL (0 mLs Intravenous Stopped 11/19/13  1353)    Labs Review Labs Reviewed  CBC WITH DIFFERENTIAL - Abnormal; Notable for the following:    WBC 12.0 (*)    Platelets 102 (*)    Neutrophils Relative % 82 (*)    Neutro Abs 9.8 (*)    Lymphocytes Relative 10 (*)    Monocytes Absolute 1.1 (*)    All other components within normal limits  COMPREHENSIVE METABOLIC PANEL - Abnormal; Notable for the following:    Sodium 136 (*)    Glucose, Bld 106 (*)    Anion gap 16 (*)    All other components within normal limits  URINALYSIS, ROUTINE W REFLEX MICROSCOPIC - Abnormal; Notable for the following:    Hgb urine dipstick MODERATE (*)    All other components within normal limits  CSF CELL COUNT WITH DIFFERENTIAL - Abnormal; Notable for the following:    RBC Count, CSF 1 (*)    All other components within normal limits  CSF CELL COUNT WITH DIFFERENTIAL - Abnormal; Notable for the following:    RBC Count, CSF 128 (*)    All other components within normal limits  GLUCOSE, CSF - Abnormal; Notable for the following:    Glucose, CSF 79 (*)    All other components within normal limits  PROTEIN, CSF - Abnormal; Notable for the following:    Total  Protein, CSF 14 (*)    All other components within normal limits  URINE MICROSCOPIC-ADD ON - Abnormal; Notable for the following:    Squamous Epithelial / LPF FEW (*)  All other components within normal limits  I-STAT CG4 LACTIC ACID, ED - Abnormal; Notable for the following:    Lactic Acid, Venous 3.20 (*)    All other components within normal limits  GRAM STAIN  RAPID STREP SCREEN  CULTURE, BLOOD (ROUTINE X 2)  CULTURE, BLOOD (ROUTINE X 2)  URINE CULTURE  CSF CULTURE  HERPES SIMPLEX VIRUS CULTURE  CULTURE, GROUP A STREP  CRYPTOCOCCAL ANTIGEN, CSF  HERPES SIMPLEX VIRUS(HSV) DNA BY PCR  VDRL, CSF  RPR  HIV ANTIBODY (ROUTINE TESTING)  INFLUENZA PANEL BY PCR (TYPE A & B, H1N1)  URINE RAPID DRUG SCREEN (HOSP PERFORMED)  I-STAT CG4 LACTIC ACID, ED    Imaging Review Ct Head Wo  Contrast  11/19/2013   CLINICAL DATA:  Headache and nuchal rigidity  EXAM: CT HEAD WITHOUT CONTRAST  TECHNIQUE: Contiguous axial images were obtained from the base of the skull through the vertex without intravenous contrast.  COMPARISON:  None.  FINDINGS: The ventricles are normal in size and configuration. There is no mass, hemorrhage, extra-axial fluid collection, or midline shift. Gray-white compartments appear normal. No acute infarct is apparent. No meningeal lesions are identified on this noncontrast enhanced study.  Bony calvarium appears intact. The mastoid air cells are clear. There is mucosal thickening in both maxillary antra as well as in multiple ethmoid air cells, more on the right than on the left. Mild mucosal thickening is also noted in the sphenoid sinus regions. Frontal sinuses are hypoplastic.  IMPRESSION: Multilevel paranasal sinus disease.  Study otherwise unremarkable.   Electronically Signed   By: Bretta Bang M.D.   On: 11/19/2013 11:38   Dg Chest Port 1 View  11/19/2013   CLINICAL DATA:  MIGRAINE LEG PAIN  EXAM: PORTABLE CHEST - 1 VIEW  COMPARISON:  None.  FINDINGS: Relatively low lung volumes. Lungs clear. Heart size upper limits normal. No effusion. Regional bones unremarkable.  IMPRESSION: No active disease.   Electronically Signed   By: Oley Balm M.D.   On: 11/19/2013 11:14     EKG Interpretation None      CRITICAL CARE Performed by: Francee Piccolo L   Total critical care time: 60 minutes  Critical care time was exclusive of separately billable procedures and treating other patients.  Critical care was necessary to treat or prevent imminent or life-threatening deterioration.  Critical care was time spent personally by me on the following activities: development of treatment plan with patient and/or surrogate as well as nursing, discussions with consultants, evaluation of patient's response to treatment, examination of patient, obtaining history  from patient or surrogate, ordering and performing treatments and interventions, ordering and review of laboratory studies, ordering and review of radiographic studies, pulse oximetry and re-evaluation of patient's condition.  MDM   Final diagnoses:  Sepsis, due to unspecified organism    Filed Vitals:   11/19/13 1448  BP: 93/53  Pulse: 128  Temp:   Resp: 25   Patient presenting with fever to ED. Pt alert, active, and oriented per age. PE showed painful ROM of cervical spine, positive kernigs. Physical examination is otherwise unremarkable. He is AAOx4, but ill appearing. Tylenol given with improvement of fever. Concern for meningitis, CT head obtained w/o evidence of intracranial bleed, LP obtained. IV Rocephin, vancomycin, ampicillin and acyclovir given to cover for meningitis. CSF results returned No evidence of meningitis. I have reviewed nursing notes, vital signs, and all appropriate lab and imaging results for this patient. Chest x-ray and urine unremarkable. Rapid strep negative.  Influenza panel sent. Despite hypotension, patient has remained stable. 3L of fluids given without further decrease in blood pressure. Will admit patient to teaching service for further management and evaluation of sepsis.      Jeannetta Ellis, PA-C 11/19/13 1558

## 2013-11-19 NOTE — ED Notes (Signed)
Per EMS- Pt reports migraine since last night, also with bilateral leg pain. Has hx of migraines. Able to move all extremities. Was ambulatory into department. Pt is a x 4. VSS

## 2013-11-19 NOTE — ED Notes (Signed)
Pt complaining of nuchal rigidity.

## 2013-11-19 NOTE — Progress Notes (Signed)
UR completed. IP appropriate. Receiving IV abx, IV fluids.

## 2013-11-19 NOTE — ED Notes (Signed)
PA aware of pt's BP after initial bolus and does not wish to activate level 1 sepsis at this time. Ordered third liter bolus. Pt tolerating well. Pt alert and oriented

## 2013-11-19 NOTE — ED Provider Notes (Signed)
Medical screening examination/treatment/procedure(s) were conducted as a shared visit with non-physician practitioner(s) and myself.  I personally evaluated the patient during the encounter.  1 day history of headache, fever, fatigue, body aches, diarrhea.  Worse with neck movement.  Ill appearing Tachycardic, fever to 102. Neuro intact. BP soft. Code sepsis called.  Empiric coverage for meningitis.  LP to be done. Fluid resuscitation and antibiotics.  Mental status stable.  Airway intact.  Unclear source of sepsis.  CXR, UA negative.  CSF unrevealing.  Possible viral syndrome.   EKG Interpretation None       Glynn OctaveStephen Toniette Devera, MD 11/19/13 [phone]

## 2013-11-20 ENCOUNTER — Encounter (HOSPITAL_COMMUNITY): Payer: Self-pay | Admitting: Internal Medicine

## 2013-11-20 DIAGNOSIS — Z21 Asymptomatic human immunodeficiency virus [HIV] infection status: Secondary | ICD-10-CM

## 2013-11-20 DIAGNOSIS — F445 Conversion disorder with seizures or convulsions: Secondary | ICD-10-CM | POA: Diagnosis present

## 2013-11-20 DIAGNOSIS — Z8669 Personal history of other diseases of the nervous system and sense organs: Secondary | ICD-10-CM

## 2013-11-20 DIAGNOSIS — R569 Unspecified convulsions: Secondary | ICD-10-CM | POA: Diagnosis present

## 2013-11-20 DIAGNOSIS — F17290 Nicotine dependence, other tobacco product, uncomplicated: Secondary | ICD-10-CM | POA: Diagnosis present

## 2013-11-20 DIAGNOSIS — D696 Thrombocytopenia, unspecified: Secondary | ICD-10-CM | POA: Diagnosis present

## 2013-11-20 DIAGNOSIS — F419 Anxiety disorder, unspecified: Secondary | ICD-10-CM | POA: Diagnosis present

## 2013-11-20 DIAGNOSIS — B20 Human immunodeficiency virus [HIV] disease: Secondary | ICD-10-CM | POA: Diagnosis present

## 2013-11-20 DIAGNOSIS — R509 Fever, unspecified: Secondary | ICD-10-CM

## 2013-11-20 LAB — CBC WITH DIFFERENTIAL/PLATELET
Basophils Absolute: 0 10*3/uL (ref 0.0–0.1)
Basophils Relative: 0 % (ref 0–1)
EOS ABS: 0 10*3/uL (ref 0.0–0.7)
Eosinophils Relative: 0 % (ref 0–5)
HCT: 40 % (ref 39.0–52.0)
Hemoglobin: 13.3 g/dL (ref 13.0–17.0)
Lymphocytes Relative: 27 % (ref 12–46)
Lymphs Abs: 1.7 10*3/uL (ref 0.7–4.0)
MCH: 31.1 pg (ref 26.0–34.0)
MCHC: 33.3 g/dL (ref 30.0–36.0)
MCV: 93.5 fL (ref 78.0–100.0)
Monocytes Absolute: 1 10*3/uL (ref 0.1–1.0)
Monocytes Relative: 16 % — ABNORMAL HIGH (ref 3–12)
Neutro Abs: 3.6 10*3/uL (ref 1.7–7.7)
Neutrophils Relative %: 57 % (ref 43–77)
PLATELETS: 106 10*3/uL — AB (ref 150–400)
RBC: 4.28 MIL/uL (ref 4.22–5.81)
RDW: 12.6 % (ref 11.5–15.5)
WBC: 6.3 10*3/uL (ref 4.0–10.5)

## 2013-11-20 LAB — BASIC METABOLIC PANEL
ANION GAP: 12 (ref 5–15)
BUN: 6 mg/dL (ref 6–23)
CHLORIDE: 105 meq/L (ref 96–112)
CO2: 22 mEq/L (ref 19–32)
Calcium: 8.6 mg/dL (ref 8.4–10.5)
Creatinine, Ser: 0.85 mg/dL (ref 0.50–1.35)
GFR calc Af Amer: >90 mL/min (ref >90)
Glucose, Bld: 93 mg/dL (ref 70–99)
POTASSIUM: 3.5 meq/L — AB (ref 3.7–5.3)
SODIUM: 139 meq/L (ref 137–147)

## 2013-11-20 LAB — URINE CULTURE
COLONY COUNT: NO GROWTH
CULTURE: NO GROWTH

## 2013-11-20 LAB — HEPATITIS PANEL, ACUTE
HCV Ab: NEGATIVE
Hep A IgM: NONREACTIVE
Hep B C IgM: NONREACTIVE
Hepatitis B Surface Ag: NEGATIVE

## 2013-11-20 NOTE — Progress Notes (Signed)
Nutrition Brief Note  Patient identified on the Malnutrition Screening Tool (MST) Report  Wt Readings from Last 15 Encounters:  11/19/13 177 lb 9.6 oz (80.559 kg)  02/07/13 168 lb 14.4 oz (76.613 kg)    Body mass index is 27.81 kg/(m^2). Patient meets criteria for overweight based on current BMI.   Current diet order is regular, patient is consuming approximately 100% of meals at this time. Labs and medications reviewed.   Pt reports that he has no had any appetite or weight changes.  No nutrition interventions warranted at this time. If nutrition issues arise, please consult RD.   Ebbie LatusHaley Hawkins RD, LDN

## 2013-11-20 NOTE — Consult Note (Signed)
[facility]    Date of Admission:  11/19/2013           Day 2 vancomycin        Day 2 ceftriaxone       Reason for Consult: Newly diagnosed HIV infection    Referring Physician: Dr. Aletta Edouard   Principal Problem:   Fever Active Problems:   HIV disease   Pseudoseizures   Thrombocytopenia   H/O migraine   Anxiety disorder   Cigar smoker   . cefTRIAXone (ROCEPHIN)  IV  2 g Intravenous Q12H  . enoxaparin (LOVENOX) injection  40 mg Subcutaneous Q24H  .  HYDROmorphone (DILAUDID) injection  1 mg Intravenous Once  . sodium chloride  3 mL Intravenous Q12H  . vancomycin  1,000 mg Intravenous Q8H    Recommendations: 1. Continue vancomycin and ceftriaxone pending final blood cultures 2. Await CD4 count and HIV viral load results 3. HIV genotype resistance test 4. Discontinue droplet cautions  Assessment: It appears that he has newly diagnosed HIV infection awaiting final confirmation from his HIV viral load. So far the source for his fever remains unclear. It is possible that his fever is due to primary HIV infection. I would continue empiric vancomycin and ceftriaxone pending further observation and his blood culture results. I interviewed him with his parents absent and he asked me to inform them about his HIV test results. They came back into the room and we discussed his results and their implications. Both parents are very supportive. He remained fairly withdrawn throughout the exam. I will followup with them tomorrow.    HPI: [name] [name] is a 23 y.o. male who was in his usual state of good health until yesterday when he developed high fever. He came to the emergency department and was admitted with a temperature of 102.8. He also had some nausea, vomiting and diarrhea. He said that he also had transient pain from his waist down. He is now feeling much better. His head CT was normal except for some chronic thickening in the maxillary and  ethmoid sinuses. His chest x-ray was negative. He underwent lumbar puncture which revealed essentially normal CSF analyses. His HIV antibody tests were positive.  He states that he has not been screened for HIV previously that he is aware of (although his mother states that he has donated plasma where he would've been screened). He states that he has 4 lifetime sexual partners both male and male, the last being in January. He does not have a history of any other sexual transmitted diseases or hepatitis that he knows of.   Review of Systems: Constitutional: positive for fevers, negative for anorexia, chills, sweats and weight loss Eyes: negative Ears, nose, mouth, throat, and face: negative Respiratory: negative Cardiovascular: negative Gastrointestinal: positive for diarrhea, nausea and vomiting, negative for abdominal pain, constipation and odynophagia Genitourinary:negative Integument/breast: negative for rash  Past Medical History  Diagnosis Date  . Migraine   . Anxiety   . Psychiatric pseudoseizure     History  Substance Use Topics  . Smoking status: Current Some Day Smoker    Types: Cigars  . Smokeless tobacco: Not on file  . Alcohol Use: Yes    No family history on file. No Known Allergies  OBJECTIVE: Blood pressure 113/59, pulse 103, temperature 98.2 F (36.8 C), temperature source Oral, resp. rate 20, height 5\' 7"  (1.702 m), weight 80.559 kg (177 lb 9.6 oz), SpO2 100.00%.  General: He is laying quietly in bed in no distress. His parents are present Skin: No rash Lymph nodes: No palpable adenopathy Oral: No oropharyngeal lesions Neck: Supple Lungs: Clear Cor: Regular S1 and S2 no murmurs Abdomen: Soft and nontender Neurologic: Alert with normal speech and conversation. No focal abnormalities.  Lab Results Lab Results  Component Value Date   WBC 6.3 11/20/2013   HGB 13.3 11/20/2013   HCT 40.0 11/20/2013   MCV 93.5 11/20/2013   PLT 106* 11/20/2013    Lab  Results  Component Value Date   CREATININE 0.85 11/20/2013   BUN 6 11/20/2013   NA 139 11/20/2013   K 3.5* 11/20/2013   CL 105 11/20/2013   CO2 22 11/20/2013    Lab Results  Component Value Date   ALT 21 11/19/2013   AST 29 11/19/2013   ALKPHOS 70 11/19/2013   BILITOT 0.8 11/19/2013     Microbiology: Recent Results (from the past 240 hour(s))  CULTURE, BLOOD (ROUTINE X 2)     Status: None   Collection Time    11/19/13 10:50 AM      Result Value Ref Range Status   Specimen Description BLOOD LEFT ARM   Final   Special Requests BOTTLES DRAWN AEROBIC AND ANAEROBIC 6CC   Final   Culture  Setup Time     Final   Value: 11/19/2013 18:54     [facility]formed [facility]   Culture     Final   Value:        BLOOD CULTURE RECEIVED NO GROWTH TO DATE CULTURE WILL BE HELD FOR 5 DAYS BEFORE ISSUING A FINAL NEGATIVE REPORT     Performed at [facility]   Report Status PENDING   Incomplete  CULTURE, BLOOD (ROUTINE X 2)     Status: None   Collection Time    11/19/13 11:38 AM      Result Value Ref Range Status   Specimen Description BLOOD RIGHT ARM   Final   Special Requests BOTTLES DRAWN AEROBIC AND ANAEROBIC 6CC   Final   Culture  Setup Time     Final   Value: 11/19/2013 18:54     Performed at [facility]   Culture     Final   Value:        BLOOD CULTURE RECEIVED NO GROWTH TO DATE CULTURE WILL BE HELD FOR 5 DAYS BEFORE ISSUING A FINAL NEGATIVE REPORT     Performed at [facility]   Report Status PENDING   Incomplete  CSF CULTURE     Status: None   Collection Time    11/19/13 12:24 PM      Result Value Ref Range Status   Specimen Description CSF   Final   Special Requests NONE   Final   Gram Stain     Final   Value: CYTOSPIN NO WBC SEEN     NO ORGANISMS SEEN     [facility]     [facility]   Culture     Final   Value: NO GROWTH 1 DAY     Performed at [facility]   Report Status PENDING   Incomplete  GRAM  STAIN     Status: None   Collection Time    11/19/13 12:24 PM      Result Value Ref Range Status   Specimen Description CSF   Final   Special Requests NONE   Final  Gram Stain     Final   Value: CYTOSPIN SLIDE     NO WBC SEEN     NO ORGANISMS SEEN   Report Status 11/19/2013 FINAL   Final  RAPID STREP SCREEN     Status: None   Collection Time    11/19/13  2:19 PM      Result Value Ref Range Status   Streptococcus, Group A Screen (Direct) NEGATIVE  NEGATIVE Final   Comment: (NOTE)     A Rapid Antigen test may result negative if the antigen level in the     sample is below the detection level of this test. The FDA has not     cleared this test as a stand-alone test therefore the rapid antigen     negative result has reflexed to a Group A Strep culture.  CULTURE, GROUP A STREP     Status: None   Collection Time    11/19/13  2:19 PM      Result Value Ref Range Status   Specimen Description THROAT   Final   Special Requests NONE   Final   Culture     Final   Value: NO SUSPICIOUS COLONIES, CONTINUING TO HOLD     Performed at [facility]   Report Status PENDING   Incomplete    Cliffton Asters, MD [facility] [phone] pager   [phone] cell 11/20/2013, 12:59 PM

## 2013-11-20 NOTE — H&P (Signed)
INTERNAL MEDICINE TEACHING ATTENDING NOTE  Day 1 of stay  Patient name: [name] [name]  MRN: [id number] Date of birth: 1990-07-09   23 y.o. african [place] male with past medical history of migraines and pseudoseizures admitted with acute history of fever, nausea, vomiting and diarrhea, severe weakness and leg pain since 2 days ago. He was hypotensive on admission. He also complained of headache and neck pain which raised suspicion for meningitis and he was administered empiric vancomycin, ceftriaxone, acyclovir, and dexamethasone. LP results were not indicative of bacterial infection, CT scan was non-revealing for an infectious cause or mass except chronic thickening int eh maxillary and ethmoid sinuses. The patient was first time diagnosed with HIV antibody subsequently. He denies any IV drug use. He accepts remote exposure to unprotected sexual intercourse, denies being screened for HIV before. He accepts having 4-5 lifetime sexual partners, male and male. He accepts having multiple sexual partners at a time. At no time, did the patient have altered mental status.   On exam, the patient is alert and oriented. He is comfortable, in no acute distress.   HEENT: PERRL, EOMI, no scleral icterus. No facial tenderness on plapation. No cervical or axillary or inguinal lymphadenopathy. Heart: RRR, hyperdynamic heart sounds no rubs, murmurs or gallops. Lungs: Clear to auscultation bilaterally, no wheezes, rales, or rhonchi. Abdomen: Soft, nontender, nondistended, BS present. Extremities: Warm, no pedal edema. Neuro: Alert and oriented X3, cranial nerves II-XII grossly intact,  strength and sensation to light touch equal in bilateral upper and lower extremities. Negative kernig's and brudzinsky's signs.   I have reviewed the chart, lab results, EKG, imaging and relevant notes of this patient.   Assessment and Plan                                                                       The patient's  presentation could be due to uncontrolled new onset HIV. However, meningitis is not ruled out until CSF culture is negative. I do not think the patient has encephalitis for the lack of altered mental status, and fungal meningitis usually does not present so acutely. However, I agree with Dr Megan Salon about continuing the antibiotics for now. Appreciate input.  He was disclosed his HIV positive diagnosis by my team this morning. When I met him, he appeared calm and did not show much emotion. I understand that he has asked Dr Megan Salon to disclose the results of his test to his parents.    I have seen and evaluated this patient and discussed it with my IM resident team.  Please see the rest of the plan per resident note from today.   Cool, [place] 11/20/2013, 1:18 PM.

## 2013-11-20 NOTE — Progress Notes (Signed)
Subjective: NAEON. Pt remained afebrile overnight and reports feeling better this morning. He is still having diarrhea but says that his headaches and nausea have improved. He was able to tolerate some food yesterday. He denies chest pain or shortness of breath.  We spoke with patient about his HIV diagnosis. He was appropriately distraught with the news and does not want the information shared with others. He denied need for any further counseling at this time.   Objective: Vital signs in last 24 hours: Filed Vitals:   11/19/13 1615 11/19/13 2208 11/20/13 0100 11/20/13 0541  BP: 104/52 125/70  113/59  Pulse: 120 109  103  Temp: 100 F (37.8 C) 98.4 F (36.9 C) 98 F (36.7 C) 98.2 F (36.8 C)  TempSrc: Oral Oral Oral Oral  Resp: 22 20  20   Height: 5\' 7"  (1.702 m)     Weight: 177 lb 9.6 oz (80.559 kg)     SpO2: 100% 97%  100%   Weight change:   Intake/Output Summary (Last 24 hours) at 11/20/13 1318 Last data filed at 11/20/13 0540  Gross per 24 hour  Intake    120 ml  Output   1502 ml  Net  -1382 ml   Physical exam: General: Better appearing, sitting up in bed  HEENT: [place]/AT, PERRLA, no nuchal rigidity Lungs: Clear to auscultation bilaterally, breaths nonlabored Heart: Tachycardia with regular rhythm, normal S1 and S2 with no murmurs  Abdomen: +BS, mildly diffuse tenderness, non-distended  Extremities: Pain to palpation of quadriceps bilaterally, no joint warmness/effusion Skin: No new rashes or ulcers  Neurologic: A&Ox3, attentive to conversation, no focal deficits   Lab Results: Basic Metabolic Panel:  Recent Labs  57/84/69 1050 11/19/13 1704 11/20/13 0934  NA 136*  --  139  K 4.4  --  3.5*  CL 99  --  105  CO2 21  --  22  GLUCOSE 106*  --  93  BUN 8  --  6  CREATININE 1.06 0.92 0.85  CALCIUM 9.4  --  8.6   Liver Function Tests:  Recent Labs  11/19/13 1050  AST 29  ALT 21  ALKPHOS 70  BILITOT 0.8  PROT 8.2  ALBUMIN 4.1   No results found for  this basename: LIPASE, AMYLASE,  in the last 72 hours No results found for this basename: AMMONIA,  in the last 72 hours CBC:  Recent Labs  11/19/13 1050 11/19/13 1704 11/20/13 0934  WBC 12.0* 11.5* 6.3  NEUTROABS 9.8*  --  3.6  HGB 14.9 14.4 13.3  HCT 43.1 42.3 40.0  MCV 92.3 95.3 93.5  PLT 102* 105* 106*   Cardiac Enzymes: No results found for this basename: CKTOTAL, CKMB, CKMBINDEX, TROPONINI,  in the last 72 hours BNP: No results found for this basename: PROBNP,  in the last 72 hours D-Dimer: No results found for this basename: DDIMER,  in the last 72 hours CBG: No results found for this basename: GLUCAP,  in the last 72 hours Hemoglobin A1C:  Recent Labs  11/19/13 1704  HGBA1C 5.1   Fasting Lipid Panel: No results found for this basename: CHOL, HDL, LDLCALC, TRIG, CHOLHDL, LDLDIRECT,  in the last 72 hours Thyroid Function Tests:  Recent Labs  11/19/13 1704  TSH 0.580   Anemia Panel: No results found for this basename: VITAMINB12, FOLATE, FERRITIN, TIBC, IRON, RETICCTPCT,  in the last 72 hours Coagulation: No results found for this basename: LABPROT, INR,  in the last 72 hours Urine Drug  Screen: Drugs of Abuse     Component Value Date/Time   LABOPIA NONE DETECTED 11/19/2013 1358   COCAINSCRNUR NONE DETECTED 11/19/2013 1358   LABBENZ NONE DETECTED 11/19/2013 1358   AMPHETMU NONE DETECTED 11/19/2013 1358   THCU NONE DETECTED 11/19/2013 1358   LABBARB NONE DETECTED 11/19/2013 1358    Alcohol Level: No results found for this basename: ETH,  in the last 72 hours Urinalysis:  Recent Labs  11/19/13 1358  COLORURINE YELLOW  LABSPEC 1.010  PHURINE 7.5  GLUCOSEU NEGATIVE  HGBUR MODERATE*  BILIRUBINUR NEGATIVE  KETONESUR NEGATIVE  PROTEINUR NEGATIVE  UROBILINOGEN 0.2  NITRITE NEGATIVE  LEUKOCYTESUR NEGATIVE   Micro Results: Recent Results (from the past 240 hour(s))  CULTURE, BLOOD (ROUTINE X 2)     Status: None   Collection Time    11/19/13 10:50 AM        Result Value Ref Range Status   Specimen Description BLOOD LEFT ARM   Final   Special Requests BOTTLES DRAWN AEROBIC AND ANAEROBIC 6CC   Final   Culture  Setup Time     Final   Value: 11/19/2013 18:54     Performed at [facility]   Culture     Final   Value:        BLOOD CULTURE RECEIVED NO GROWTH TO DATE CULTURE WILL BE HELD FOR 5 DAYS BEFORE ISSUING A FINAL NEGATIVE REPORT     Performed at [facility]   Report Status PENDING   Incomplete  CULTURE, BLOOD (ROUTINE X 2)     Status: None   Collection Time    11/19/13 11:38 AM      Result Value Ref Range Status   Specimen Description BLOOD RIGHT ARM   Final   Special Requests BOTTLES DRAWN AEROBIC AND ANAEROBIC 6CC   Final   Culture  Setup Time     Final   Value: 11/19/2013 18:54     Performed at [facility]   Culture     Final   Value:        BLOOD CULTURE RECEIVED NO GROWTH TO DATE CULTURE WILL BE HELD FOR 5 DAYS BEFORE ISSUING A FINAL NEGATIVE REPORT     Performed at [facility]   Report Status PENDING   Incomplete  CSF CULTURE     Status: None   Collection Time    11/19/13 12:24 PM      Result Value Ref Range Status   Specimen Description CSF   Final   Special Requests NONE   Final   Gram Stain     Final   Value: CYTOSPIN NO WBC SEEN     NO ORGANISMS SEEN     Performed at [facility]     Performed at [facility]   Culture     Final   Value: NO GROWTH 1 DAY     Performed at [facility]   Report Status PENDING   Incomplete  GRAM STAIN     Status: None   Collection Time    11/19/13 12:24 PM      Result Value Ref Range Status   Specimen Description CSF   Final   Special Requests NONE   Final   Gram Stain     Final   Value: CYTOSPIN SLIDE     NO WBC SEEN     NO ORGANISMS SEEN   Report Status 11/19/2013 FINAL   Final  URINE CULTURE  Status: None   Collection Time    11/19/13  1:58 PM      Result Value Ref Range Status   Specimen Description URINE,  CLEAN CATCH   Final   Special Requests NONE   Final   Culture  Setup Time     Final   Value: 11/19/2013 14:39     [facility]formed [facility]   Colony Count     Final   Value: NO GROWTH     [facility]   Culture     Final   Value: NO GROWTH     [facility]formed at [facility]   Report Status 11/20/2013 FINAL   Final  RAPID STREP SCREEN     Status: None   Collection Time    11/19/13  2:19 PM      Result Value Ref Range Status   Streptococcus, Group A Screen (Direct) NEGATIVE  NEGATIVE Final   Comment: (NOTE)     A Rapid Antigen test may result negative if the antigen level in the     sample is below the detection level of this test. The FDA has not     cleared this test as a stand-alone test therefore the rapid antigen     negative result has reflexed to a Group A Strep culture.  CULTURE, GROUP A STREP     Status: None   Collection Time    11/19/13  2:19 PM      Result Value Ref Range Status   Specimen Description THROAT   Final   Special Requests NONE   Final   Culture     Final   Value: NO SUSPICIOUS COLONIES, CONTINUING TO HOLD     Performed at [facility]   Report Status PENDING   Incomplete   Studies/Results: Ct Head Wo Contrast  11/19/2013   CLINICAL DATA:  Headache and nuchal rigidity  EXAM: CT HEAD WITHOUT CONTRAST  TECHNIQUE: Contiguous axial images were obtained from the base of the skull through the vertex without intravenous contrast.  COMPARISON:  None.  FINDINGS: The ventricles are normal in size and configuration. There is no mass, hemorrhage, extra-axial fluid collection, or midline shift. Gray-white compartments appear normal. No acute infarct is apparent. No meningeal lesions are identified on this noncontrast enhanced study.  Bony calvarium appears intact. The mastoid air cells are clear. There is mucosal thickening in both maxillary antra as well as in multiple ethmoid air cells, more on the right than on the left. Mild  mucosal thickening is also noted in the sphenoid sinus regions. Frontal sinuses are hypoplastic.  IMPRESSION: Multilevel paranasal sinus disease.  Study otherwise unremarkable.   Electronically Signed   By: Bretta BangWilliam  Woodruff M.D.   On: 11/19/2013 11:38   Dg Chest Port 1 View  11/19/2013   CLINICAL DATA:  MIGRAINE LEG PAIN  EXAM: PORTABLE CHEST - 1 VIEW  COMPARISON:  None.  FINDINGS: Relatively low lung volumes. Lungs clear. Heart size upper limits normal. No effusion. Regional bones unremarkable.  IMPRESSION: No active disease.   Electronically Signed   By: Oley Balmaniel  Hassell M.D.   On: 11/19/2013 11:14   Medications: I have reviewed the patient's current medications. Scheduled Meds: . cefTRIAXone (ROCEPHIN)  IV  2 g Intravenous Q12H  . enoxaparin (LOVENOX) injection  40 mg Subcutaneous Q24H  .  HYDROmorphone (DILAUDID) injection  1 mg Intravenous Once  . sodium chloride  3 mL Intravenous Q12H  . vancomycin  1,000  mg Intravenous Q8H   Continuous Infusions: . sodium chloride 1,000 mL (11/20/13 0540)   PRN Meds:.acetaminophen, acetaminophen, HYDROcodone-acetaminophen, ondansetron (ZOFRAN) IV, ondansetron Assessment/Plan: Assessment & Plan:  Mr. Apolinar is a 23 yo man with PMH of migraines, anxiety and pseudo seizures who presented with nausea, vomiting, diarrhea, nuchal rigidity who was found to be HIV positive.   1. HIV-likely primary acute presentation  On presentation patient had a fever of 102.8, was tachycardic to 127 , tachypnic to 27, hypotensive to 80s/40s with a WBC of 12. Lactic acid 3.2. Nuchal rigidity with associated headache was concerning for meningitis. However, lumbar puncture showed normal glucose, low protein, with normal gram stain and was negative for cryptococcal antigen making meningeal infection less likely. Pt had initial positive HIV  Elisa with confirmatory positive antibody. His absolute lymphocyte count of 1.2 is in the low normal range. Patient was informed of his  diagnosis this morning. He does not want this information shared with anyone. Infectious disease has been consulted and will see the patient today. Likely acute presentation of initial HIV infection. We will f/u on blood culture, HSV PCR, stool pathogen study to rule out additional cause of symptoms. Patient has improved greatly since arrival and vitals are stable except for mild tachycardia. Will follow IDs lead as to when to start HART therapy to avoid IRIS  - ID consult, appreciate recommendations  - Continue NS at 125 ml/hr - f/u blood cultures, HSV viral culture  - Continue prophylactic vancomycin, rocephin until definitive culture results - can d/c acyclovir as herpes seems unlikely given presentation - Continuous cardiac monitoring  - f/u CSF viral panel - Zofran PRN for nausea - RPR negative - f/u CSF VDRL -influenza panel --> negative  - f/u GC/chlaymidia  - Rapid drug screen -> negative  - f/u Cryptococcal Ag--> negative  - f/u rapid strep screen --> negative - fu Gi pathogen panel, CD4 count, HIV-1 RNA, hepatitis panel, quantiferon gold   2. Migraine-History of migraines in the past that were treated by a drug patient can't remember which was stopped when symptoms became intermittent. Current migraines likely exacerbated by infection and have improved since hospitalization.  - Continue to monitor  - Norco q 4 hours for pain.   Diet: Regular  DVT prophylaxis: Lovenox 40 mg BID  Droplet precautions  Dispo: Disposition is deferred at this time, awaiting improvement of current medical problems. Anticipated discharge in approximately 2-3 day(s).   The patient does not have a current PCP (No Pcp Per Patient) and does need an [facility] hospital follow-up appointment after discharge.  The patient does not have transportation limitations that hinder transportation to clinic appointments. .Services Needed at time of discharge: Y = Yes, Blank = No PT:   OT:   RN:   Equipment:   Other:      LOS: 1 day   Rich Number, MD 11/20/2013, 1:18 PM

## 2013-11-21 LAB — BASIC METABOLIC PANEL
Anion gap: 9 (ref 5–15)
BUN: 5 mg/dL — ABNORMAL LOW (ref 6–23)
CALCIUM: 8.8 mg/dL (ref 8.4–10.5)
CO2: 29 meq/L (ref 19–32)
CREATININE: 0.7 mg/dL (ref 0.50–1.35)
Chloride: 103 mEq/L (ref 96–112)
GFR calc Af Amer: >90 mL/min (ref >90)
GFR calc non Af Amer: >90 mL/min (ref >90)
Glucose, Bld: 95 mg/dL (ref 70–99)
Potassium: 3.7 mEq/L (ref 3.7–5.3)
Sodium: 141 mEq/L (ref 137–147)

## 2013-11-21 LAB — GI PATHOGEN PANEL BY PCR, STOOL
C difficile toxin A/B: NEGATIVE
CRYPTOSPORIDIUM BY PCR: NEGATIVE
Campylobacter by PCR: NEGATIVE
E COLI (ETEC) LT/ST: NEGATIVE
E coli (STEC): NEGATIVE
E coli 0157 by PCR: NEGATIVE
G lamblia by PCR: NEGATIVE
NOROVIRUS G1/G2: NEGATIVE
ROTAVIRUS A BY PCR: NEGATIVE
SHIGELLA BY PCR: POSITIVE
Salmonella by PCR: NEGATIVE

## 2013-11-21 LAB — T-HELPER CELLS (CD4) COUNT (NOT AT ARMC)
CD4 % Helper T Cell: 22 % — ABNORMAL LOW (ref 33–55)
CD4 T CELL ABS: 350 /uL — AB (ref 400–2700)

## 2013-11-21 LAB — CULTURE, GROUP A STREP

## 2013-11-21 LAB — VDRL, CSF: VDRL Quant, CSF: NONREACTIVE

## 2013-11-21 MED ORDER — HEPATITIS B VAC RECOMBINANT 10 MCG/ML IJ SUSP
1.0000 mL | Freq: Once | INTRAMUSCULAR | Status: AC
  Administered 2013-11-21: 10 ug via INTRAMUSCULAR
  Filled 2013-11-21: qty 1

## 2013-11-21 MED ORDER — HEPATITIS A VACCINE 1440 EL U/ML IM SUSP
1.0000 mL | Freq: Once | INTRAMUSCULAR | Status: AC
  Administered 2013-11-21: 1440 [IU] via INTRAMUSCULAR
  Filled 2013-11-21: qty 1

## 2013-11-21 NOTE — Care Management Note (Signed)
    Page 1 of 1   11/21/2013     2:14:40 PM CARE MANAGEMENT NOTE 11/21/2013  Patient:  [name] [name]   Account Number:  [id number]  Date Initiated:  11/19/2013  Documentation initiated [facility]:  [facility]  Subjective/Objective Assessment:   fever, Sepsis     Action/Plan:   Anticipated DC Date:  11/21/2013   Anticipated DC Plan:  HOME W HOME HEALTH SERVICES      DC Planning Services  CM consult      Choice offered to / List presented to:             Status of service:  Completed, signed off Medicare Important Message given?  NO (If response is "NO", the following Medicare IM given date fields will be blank) Date Medicare IM given:   Medicare IM given by:   Date Additional Medicare IM given:   Additional Medicare IM given by:    Discharge Disposition:  HOME/SELF CARE  Per UR Regulation:  Reviewed for med. necessity/level of care/duration of stay  If discussed at Long Length of Stay Meetings, dates discussed:    Comments:

## 2013-11-21 NOTE — Progress Notes (Addendum)
Subjective: Patient reports he had 1 episode of diarrhea last night, but is feeling much better overall. He seems to have accepted his diagnosis and is ready to start treatment. He is in better spirits this AM. He denies fever/chills, abdominal pain, leg pain/weakness.   Objective: Vital signs in last 24 hours: Filed Vitals:   11/20/13 0541 11/20/13 1322 11/20/13 2005 11/21/13 0511  BP: 113/59 126/79 118/80 113/59  Pulse: 103 98 96 64  Temp: 98.2 F (36.8 C) 99 F (37.2 C) 98.4 F (36.9 C) 97.7 F (36.5 C)  TempSrc: Oral Oral Oral Oral  Resp: 20 20 20 18   Height:      Weight:      SpO2: 100% 99% 98% 97%   Weight change:   Intake/Output Summary (Last 24 hours) at 11/21/13 0850 Last data filed at 11/20/13 1833  Gross per 24 hour  Intake 1693.75 ml  Output      0 ml  Net 1693.75 ml   Physical Exam General: sitting up in bed, NAD, in better spirits  HEENT: [place]/AT, PERRLA, no nuchal rigidity  Lungs: Clear to auscultation bilaterally, breaths nonlabored  Heart: RRR, normal S1 and S2 with no murmurs  Abdomen: +BS, non-distended, nontender  Extremities: no pain to palpation of lower extremities, no joint warmness/effusion  Skin: No new rashes or ulcers  Neurologic: A&Ox3, attentive to conversation, no focal deficits   Lab Results: Basic Metabolic Panel:  Recent Labs Lab 11/19/13 1050 11/19/13 1704 11/20/13 0934  NA 136*  --  139  K 4.4  --  3.5*  CL 99  --  105  CO2 21  --  22  GLUCOSE 106*  --  93  BUN 8  --  6  CREATININE 1.06 0.92 0.85  CALCIUM 9.4  --  8.6   Liver Function Tests:  Recent Labs Lab 11/19/13 1050  AST 29  ALT 21  ALKPHOS 70  BILITOT 0.8  PROT 8.2  ALBUMIN 4.1   No results found for this basename: LIPASE, AMYLASE,  in the last 168 hours No results found for this basename: AMMONIA,  in the last 168 hours CBC:  Recent Labs Lab 11/19/13 1050 11/19/13 1704 11/20/13 0934  WBC 12.0* 11.5* 6.3  NEUTROABS 9.8*  --  3.6  HGB 14.9 14.4  13.3  HCT 43.1 42.3 40.0  MCV 92.3 95.3 93.5  PLT 102* 105* 106*   Cardiac Enzymes: No results found for this basename: CKTOTAL, CKMB, CKMBINDEX, TROPONINI,  in the last 168 hours BNP: No results found for this basename: PROBNP,  in the last 168 hours D-Dimer: No results found for this basename: DDIMER,  in the last 168 hours CBG: No results found for this basename: GLUCAP,  in the last 168 hours Hemoglobin A1C:  Recent Labs Lab 11/19/13 1704  HGBA1C 5.1   Fasting Lipid Panel: No results found for this basename: CHOL, HDL, LDLCALC, TRIG, CHOLHDL, LDLDIRECT,  in the last 168 hours Thyroid Function Tests:  Recent Labs Lab 11/19/13 1704  TSH 0.580   Coagulation: No results found for this basename: LABPROT, INR,  in the last 168 hours Anemia Panel: No results found for this basename: VITAMINB12, FOLATE, FERRITIN, TIBC, IRON, RETICCTPCT,  in the last 168 hours Urine Drug Screen: Drugs of Abuse     Component Value Date/Time   LABOPIA NONE DETECTED 11/19/2013 1358   COCAINSCRNUR NONE DETECTED 11/19/2013 1358   LABBENZ NONE DETECTED 11/19/2013 1358   AMPHETMU NONE DETECTED 11/19/2013 1358  THCU NONE DETECTED 11/19/2013 1358   LABBARB NONE DETECTED 11/19/2013 1358    Alcohol Level: No results found for this basename: ETH,  in the last 168 hours Urinalysis:  Recent Labs Lab 11/19/13 1358  COLORURINE YELLOW  LABSPEC 1.010  PHURINE 7.5  GLUCOSEU NEGATIVE  HGBUR MODERATE*  BILIRUBINUR NEGATIVE  KETONESUR NEGATIVE  PROTEINUR NEGATIVE  UROBILINOGEN 0.2  NITRITE NEGATIVE  LEUKOCYTESUR NEGATIVE    Micro Results: Recent Results (from the past 240 hour(s))  CULTURE, BLOOD (ROUTINE X 2)     Status: None   Collection Time    11/19/13 10:50 AM      Result Value Ref Range Status   Specimen Description BLOOD LEFT ARM   Final   Special Requests BOTTLES DRAWN AEROBIC AND ANAEROBIC 6CC   Final   Culture  Setup Time     Final   Value: 11/19/2013 18:54     Performed at [facility]   Culture     Final   Value:        BLOOD CULTURE RECEIVED NO GROWTH TO DATE CULTURE WILL BE HELD FOR 5 DAYS BEFORE ISSUING A FINAL NEGATIVE REPORT     Performed at [facility]   Report Status PENDING   Incomplete  CULTURE, BLOOD (ROUTINE X 2)     Status: None   Collection Time    11/19/13 11:38 AM      Result Value Ref Range Status   Specimen Description BLOOD RIGHT ARM   Final   Special Requests BOTTLES DRAWN AEROBIC AND ANAEROBIC 6CC   Final   Culture  Setup Time     Final   Value: 11/19/2013 18:54     Performed at [facility]   Culture     Final   Value:        BLOOD CULTURE RECEIVED NO GROWTH TO DATE CULTURE WILL BE HELD FOR 5 DAYS BEFORE ISSUING A FINAL NEGATIVE REPORT     Performed at [facility]   Report Status PENDING   Incomplete  CSF CULTURE     Status: None   Collection Time    11/19/13 12:24 PM      Result Value Ref Range Status   Specimen Description CSF   Final   Special Requests NONE   Final   Gram Stain     Final   Value: CYTOSPIN NO WBC SEEN     NO ORGANISMS SEEN     Performed at [facility]     Performed at [facility]   Culture     Final   Value: NO GROWTH 1 DAY     Performed at [facility]   Report Status PENDING   Incomplete  GRAM STAIN     Status: None   Collection Time    11/19/13 12:24 PM      Result Value Ref Range Status   Specimen Description CSF   Final   Special Requests NONE   Final   Gram Stain     Final   Value: CYTOSPIN SLIDE     NO WBC SEEN     NO ORGANISMS SEEN   Report Status 11/19/2013 FINAL   Final  URINE CULTURE     Status: None   Collection Time    11/19/13  1:58 PM      Result Value Ref Range Status   Specimen Description URINE, CLEAN CATCH   Final   Special Requests NONE  Final   Culture  Setup Time     Final   Value: 11/19/2013 14:39     Performed at [facility] Count     Final   Value: NO GROWTH     Performed at [facility]     Culture     Final   Value: NO GROWTH     Performed at [facility]   Report Status 11/20/2013 FINAL   Final  RAPID STREP SCREEN     Status: None   Collection Time    11/19/13  2:19 PM      Result Value Ref Range Status   Streptococcus, Group A Screen (Direct) NEGATIVE  NEGATIVE Final   Comment: (NOTE)     A Rapid Antigen test may result negative if the antigen level in the     sample is below the detection level of this test. The FDA has not     cleared this test as a stand-alone test therefore the rapid antigen     negative result has reflexed to a Group A Strep culture.  CULTURE, GROUP A STREP     Status: None   Collection Time    11/19/13  2:19 PM      Result Value Ref Range Status   Specimen Description THROAT   Final   Special Requests NONE   Final   Culture     Final   Value: NO SUSPICIOUS COLONIES, CONTINUING TO HOLD     Performed at [facility]   Report Status PENDING   Incomplete   Studies/Results: Ct Head Wo Contrast  11/19/2013   CLINICAL DATA:  Headache and nuchal rigidity  EXAM: CT HEAD WITHOUT CONTRAST  TECHNIQUE: Contiguous axial images were obtained from the base of the skull through the vertex without intravenous contrast.  COMPARISON:  None.  FINDINGS: The ventricles are normal in size and configuration. There is no mass, hemorrhage, extra-axial fluid collection, or midline shift. Gray-white compartments appear normal. No acute infarct is apparent. No meningeal lesions are identified on this noncontrast enhanced study.  Bony calvarium appears intact. The mastoid air cells are clear. There is mucosal thickening in both maxillary antra as well as in multiple ethmoid air cells, more on the right than on the left. Mild mucosal thickening is also noted in the sphenoid sinus regions. Frontal sinuses are hypoplastic.  IMPRESSION: Multilevel paranasal sinus disease.  Study otherwise unremarkable.   Electronically Signed   By: Bretta Bang M.D.   On:  11/19/2013 11:38   Dg Chest Port 1 View  11/19/2013   CLINICAL DATA:  MIGRAINE LEG PAIN  EXAM: PORTABLE CHEST - 1 VIEW  COMPARISON:  None.  FINDINGS: Relatively low lung volumes. Lungs clear. Heart size upper limits normal. No effusion. Regional bones unremarkable.  IMPRESSION: No active disease.   Electronically Signed   By: Oley Balm M.D.   On: 11/19/2013 11:14   Medications: I have reviewed the patient's current medications. Scheduled Meds: . cefTRIAXone (ROCEPHIN)  IV  2 g Intravenous Q12H  . enoxaparin (LOVENOX) injection  40 mg Subcutaneous Q24H  .  HYDROmorphone (DILAUDID) injection  1 mg Intravenous Once  . sodium chloride  3 mL Intravenous Q12H  . vancomycin  1,000 mg Intravenous Q8H   Continuous Infusions:  PRN Meds:.acetaminophen, acetaminophen, HYDROcodone-acetaminophen, ondansetron (ZOFRAN) IV, ondansetron Assessment/Plan: Mr. 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   1. HIV-likely primary acute presentation On presentation patient had a fever of 102.8, was tachycardic to 127 , tachypnic to 27, hypotensive to 80s/40s with a WBC of 12. Lactic acid 3.2. Nuchal rigidity with associated headache was concerning for meningitis. However, lumbar puncture showed normal glucose, low protein, with normal gram stain and was negative for cryptococcal antigen making meningeal infection less likely. Pt had initial positive HIV Elisa with confirmatory positive antibody. His absolute lymphocyte count of 1.2 is in the low normal range. Patient was informed of his diagnosis this morning. He does not want this information shared with anyone. Infectious disease consulted and following. Likely acute presentation of initial HIV infection. We will f/u on blood culture, HSV PCR, stool pathogen study to rule out additional cause of symptoms. Patient has improved greatly since  arrival and vitals are stable except for mild tachycardia. Will follow IDs lead as to when to start HART therapy to avoid IRIS.  - ID consulted, appreciate recommendations --> d/c droplet precautions. Awaiting CD4 count and viral load results. Can d/c IV antibiotics once final blood cultures negative - f/u blood cultures --> no growth - f/u HSV viral culture, pending - can d/c acyclovir as herpes seems unlikely given presentation  - Continuous cardiac monitoring  - f/u CSF viral panel, pending - Zofran PRN for nausea  - RPR negative  - f/u CSF VDRL, pending - Influenza panel --> negative  - f/u GC/chlaymidia, pending - Rapid drug screen -> negative  - f/u Cryptococcal Ag--> negative  - f/u rapid strep screen --> negative  - fu GI pathogen panel, pending  - f/u CD4 count, HIV-1 RNA - f/u hepatitis panel--> HepB sAg neg, HCV Ab neg, HepA IgM neg, HepB c IgMneg. Patient to receive Hep A and B vaccines. - f/u quantiferon gold, pending  2. Migraine-History of migraines in the past that were treated by a drug patient can't remember which was stopped when symptoms became intermittent. Current migraines likely exacerbated by infection and have improved since hospitalization.  - Continue to monitor  - Norco q 4 hours for pain.   Diet: Regular  DVT prophylaxis: Lovenox 40 mg BID  Dispo: Discharge likely today.   The patient does not have a current PCP (No Pcp Per Patient) and does need an [facility] hospital follow-up appointment after discharge.  The patient does not have transportation limitations that hinder transportation to clinic appointments.  .Services Needed at time of discharge: Y = Yes, Blank = No PT:   OT:   RN:   Equipment:   Other:     LOS: 2 days   Rich Number, MD 11/21/2013, 8:50 AM

## 2013-11-21 NOTE — Discharge Summary (Signed)
[name] Erdmann to be D/C'd Home per MD order.  Discussed with the patient and all questions fully answered.    Medication List    Notice   You have not been prescribed any medications.      VVS, Skin clean, dry and intact without evidence of skin break down, no evidence of skin tears noted. IV catheter discontinued intact. Site without signs and symptoms of complications. Dressing and pressure applied.  An After Visit Summary was printed and given to the patient.  D/c education completed with patient/family including follow up instructions, medication list, d/c activities limitations if indicated, with other d/c instructions as indicated by MD - patient able to verbalize understanding, all questions fully answered.   Patient instructed to return to ED, call 911, or call MD for any changes in condition.   Patient escorted via WC, and D/C home via private auto.  Beckey DowningFlores, Riannon Mukherjee F 11/21/2013 2:51 PM

## 2013-11-21 NOTE — Discharge Instructions (Signed)
It was a pleasure taking care of you, Mr. [name] [name]. Please go to your follow-up appointment with Dr. Orvan Falconerampbell on August 20th at 3:30pm. Good luck in school!

## 2013-11-21 NOTE — Progress Notes (Signed)
  PROGRESS NOTE MEDICINE TEACHING ATTENDING   Day 2 of stay Patient name: [name] [name]   Medical record number: [id number] Date of birth: September 25, 1990    I met and evaluated the patient and discussed his care with my team, Dr Algis Liming and Dr Arcelia Jew. I have reviewed the note by Dr Arcelia Jew and agree with the documentation. The presentation unlikely meningitis. CSF cultures no growth 2 days. Likely acute HIV febrile illness. Would consult Dr Megan Salon about possible discharge if he is ok with it after dc'ing antibiotics.  I have discussed the care of this patient with my IM team residents. Please see the resident note for details.  [place], [place] 11/21/2013, 11:13 AM.

## 2013-11-21 NOTE — Progress Notes (Signed)
Subjective: [name] [name]. Pt has remained afebrile >24 hours. His blood pressure has been stable and is not longer tachycardic. Pt reports he has only had one BM this morning that was still loose but more formed than yesterday. He denied nausea/vomting, chest pain, shortness of breath. He has been tolerating a regular diet.  Pt had questions about when he would be able to start HAART therapy and when he could go home. 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   Objective: Vital signs in last 24 hours: Filed Vitals:   11/20/13 0541 11/20/13 1322 11/20/13 2005 11/21/13 0511  BP: 113/59 126/79 118/80 113/59  Pulse: 103 98 96 64  Temp: 98.2 F (36.8 C) 99 F (37.2 C) 98.4 F (36.9 C) 97.7 F (36.5 C)  TempSrc: Oral Oral Oral Oral  Resp: 20 20 20 18   Height:      Weight:      SpO2: 100% 99% 98% 97%   Weight change:   Intake/Output Summary (Last 24 hours) at 11/21/13 0810 Last data filed at 11/20/13 1833  Gross per 24 hour  Intake 1693.75 ml  Output      0 ml  Net 1693.75 ml   Physical exam: General: well appearing, sitting up in bed, no acute distress  HEENT: [place]/AT, PERRLA, no nuchal rigidity Lungs: Clear to auscultation bilaterally, breaths nonlabored Heart: RRR, normal S1 and S2 with no murmurs  Abdomen: +BS, mildly diffuse tenderness, non-distended  Extremities: Pain to palpation of quadriceps bilaterally, no joint warmness/effusion Skin: No new rashes or ulcers  Neurologic: A&Ox3, attentive to conversation, no focal deficits   Lab Results: Basic Metabolic Panel:  Recent Labs  16/10/96 1050 11/19/13 1704 11/20/13 0934  NA 136*  --  139  K 4.4  --  3.5*  CL 99  --  105  CO2 21  --  22  GLUCOSE 106*  --  93  BUN 8  --  6  CREATININE 1.06 0.92 0.85  CALCIUM 9.4  --  8.6   Liver Function Tests:  Recent Labs  11/19/13 1050  AST 29  ALT 21  ALKPHOS 70  BILITOT 0.8  PROT 8.2  ALBUMIN 4.1   No  results found for this basename: LIPASE, AMYLASE,  in the last 72 hours No results found for this basename: AMMONIA,  in the last 72 hours CBC:  Recent Labs  11/19/13 1050 11/19/13 1704 11/20/13 0934  WBC 12.0* 11.5* 6.3  NEUTROABS 9.8*  --  3.6  HGB 14.9 14.4 13.3  HCT 43.1 42.3 40.0  MCV 92.3 95.3 93.5  PLT 102* 105* 106*   Cardiac Enzymes: No results found for this basename: CKTOTAL, CKMB, CKMBINDEX, TROPONINI,  in the last 72 hours BNP: No results found for this basename: PROBNP,  in the last 72 hours D-Dimer: No results found for this basename: DDIMER,  in the last 72 hours CBG: No results found for this basename: GLUCAP,  in the last 72 hours Hemoglobin A1C:  Recent Labs  11/19/13 1704  HGBA1C 5.1   Fasting Lipid Panel: No results found for this basename: CHOL, HDL, LDLCALC, TRIG, CHOLHDL, LDLDIRECT,  in the last 72 hours Thyroid Function Tests:  Recent Labs  11/19/13 1704  TSH 0.580   Anemia Panel: No results found for this basename: VITAMINB12, FOLATE, FERRITIN, TIBC, IRON, RETICCTPCT,  in the last 72 hours Coagulation: No results found for this basename: LABPROT,  INR,  in the last 72 hours Urine Drug Screen: Drugs of Abuse     Component Value Date/Time   LABOPIA NONE DETECTED 11/19/2013 1358   COCAINSCRNUR NONE DETECTED 11/19/2013 1358   LABBENZ NONE DETECTED 11/19/2013 1358   AMPHETMU NONE DETECTED 11/19/2013 1358   THCU NONE DETECTED 11/19/2013 1358   LABBARB NONE DETECTED 11/19/2013 1358    Alcohol Level: No results found for this basename: ETH,  in the last 72 hours Urinalysis:  Recent Labs  11/19/13 1358  COLORURINE YELLOW  LABSPEC 1.010  PHURINE 7.5  GLUCOSEU NEGATIVE  HGBUR MODERATE*  BILIRUBINUR NEGATIVE  KETONESUR NEGATIVE  PROTEINUR NEGATIVE  UROBILINOGEN 0.2  NITRITE NEGATIVE  LEUKOCYTESUR NEGATIVE   Micro Results: Recent Results (from the past 240 hour(s))  CULTURE, BLOOD (ROUTINE X 2)     Status: None   Collection Time     11/19/13 10:50 AM      Result Value Ref Range Status   Specimen Description BLOOD LEFT ARM   Final   Special Requests BOTTLES DRAWN AEROBIC AND ANAEROBIC 6CC   Final   Culture  Setup Time     Final   Value: 11/19/2013 18:54     Performed at [facility]   Culture     Final   Value:        BLOOD CULTURE RECEIVED NO GROWTH TO DATE CULTURE WILL BE HELD FOR 5 DAYS BEFORE ISSUING A FINAL NEGATIVE REPORT     Performed at [facility]   Report Status PENDING   Incomplete  CULTURE, BLOOD (ROUTINE X 2)     Status: None   Collection Time    11/19/13 11:38 AM      Result Value Ref Range Status   Specimen Description BLOOD RIGHT ARM   Final   Special Requests BOTTLES DRAWN AEROBIC AND ANAEROBIC 6CC   Final   Culture  Setup Time     Final   Value: 11/19/2013 18:54     Performed at [facility]   Culture     Final   Value:        BLOOD CULTURE RECEIVED NO GROWTH TO DATE CULTURE WILL BE HELD FOR 5 DAYS BEFORE ISSUING A FINAL NEGATIVE REPORT     Performed at [facility]   Report Status PENDING   Incomplete  CSF CULTURE     Status: None   Collection Time    11/19/13 12:24 PM      Result Value Ref Range Status   Specimen Description CSF   Final   Special Requests NONE   Final   Gram Stain     Final   Value: CYTOSPIN NO WBC SEEN     NO ORGANISMS SEEN     Performed at [facility]     Performed at [facility]   Culture     Final   Value: NO GROWTH 1 DAY     Performed at [facility]   Report Status PENDING   Incomplete  GRAM STAIN     Status: None   Collection Time    11/19/13 12:24 PM      Result Value Ref Range Status   Specimen Description CSF   Final   Special Requests NONE   Final   Gram Stain     Final   Value: CYTOSPIN SLIDE     NO WBC SEEN     NO ORGANISMS SEEN   Report Status 11/19/2013 FINAL  Final  URINE CULTURE     Status: None   Collection Time    11/19/13  1:58 PM      Result Value Ref Range Status   Specimen  Description URINE, CLEAN CATCH   Final   Special Requests NONE   Final   Culture  Setup Time     Final   Value: 11/19/2013 14:39     Performed at [facility] Count     Final   Value: NO GROWTH     Performed at [facility]   Culture     Final   Value: NO GROWTH     Performed at [facility]   Report Status 11/20/2013 FINAL   Final  RAPID STREP SCREEN     Status: None   Collection Time    11/19/13  2:19 PM      Result Value Ref Range Status   Streptococcus, Group A Screen (Direct) NEGATIVE  NEGATIVE Final   Comment: (NOTE)     A Rapid Antigen test may result negative if the antigen level in the     sample is below the detection level of this test. The FDA has not     cleared this test as a stand-alone test therefore the rapid antigen     negative result has reflexed to a Group A Strep culture.  CULTURE, GROUP A STREP     Status: None   Collection Time    11/19/13  2:19 PM      Result Value Ref Range Status   Specimen Description THROAT   Final   Special Requests NONE   Final   Culture     Final   Value: NO SUSPICIOUS COLONIES, CONTINUING TO HOLD     Performed at [facility]   Report Status PENDING   Incomplete   Studies/Results: Ct Head Wo Contrast  11/19/2013   CLINICAL DATA:  Headache and nuchal rigidity  EXAM: CT HEAD WITHOUT CONTRAST  TECHNIQUE: Contiguous axial images were obtained from the base of the skull through the vertex without intravenous contrast.  COMPARISON:  None.  FINDINGS: The ventricles are normal in size and configuration. There is no mass, hemorrhage, extra-axial fluid collection, or midline shift. Gray-white compartments appear normal. No acute infarct is apparent. No meningeal lesions are identified on this noncontrast enhanced study.  Bony calvarium appears intact. The mastoid air cells are clear. There is mucosal thickening in both maxillary antra as well as in multiple ethmoid air cells, more on the right than on  the left. Mild mucosal thickening is also noted in the sphenoid sinus regions. Frontal sinuses are hypoplastic.  IMPRESSION: Multilevel paranasal sinus disease.  Study otherwise unremarkable.   Electronically Signed   By: Bretta Bang M.D.   On: 11/19/2013 11:38   Dg Chest Port 1 View  11/19/2013   CLINICAL DATA:  MIGRAINE LEG PAIN  EXAM: PORTABLE CHEST - 1 VIEW  COMPARISON:  None.  FINDINGS: Relatively low lung volumes. Lungs clear. Heart size upper limits normal. No effusion. Regional bones unremarkable.  IMPRESSION: No active disease.   Electronically Signed   By: Oley Balm M.D.   On: 11/19/2013 11:14   Medications: I have reviewed the patient's current medications. Scheduled Meds: . cefTRIAXone (ROCEPHIN)  IV  2 g Intravenous Q12H  . enoxaparin (LOVENOX) injection  40 mg Subcutaneous Q24H  .  HYDROmorphone (DILAUDID) injection  1 mg Intravenous Once  . sodium chloride  3  mL Intravenous Q12H  . vancomycin  1,000 mg Intravenous Q8H   Continuous Infusions:   PRN Meds:.acetaminophen, acetaminophen, HYDROcodone-acetaminophen, ondansetron (ZOFRAN) IV, ondansetron Assessment/Plan: Assessment & Plan:  Mr. 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   1. HIV-likely primary acute presentation  On presentation patient had a fever of 102.8, was tachycardic to 127 , tachypnic to 27, hypotensive to 80s/40s with a WBC of 12. Lactic acid 3.2. Nuchal rigidity with associated headache was concerning for meningitis. However, lumbar puncture showed normal glucose, low protein, with normal gram stain and was negative for cryptococcal antigen making meningeal infection less likely. Pt had initial positive HIV  Elisa with confirmatory positive antibody. His absolute lymphocyte count of 1.2 is in the low normal range. Patient was informed of his diagnosis yesterday. He does not want  this information shared with anyone. Infectious disease saw pt yesterday and is following daily. Likely acute presentation of initial HIV infection. We will f/u on blood culture, HSV PCR, stool pathogen study to rule out additional cause of symptoms. Hepatitis panel was negative including surface antibody. Patient has improved greatly since arrival and vitals are stable except for mild tachycardia. Waiting for CD4 count and HIV genotype before making final decision about HAART therapy. Pt could potentially go home today and f/u as outpatient. - ID consult, appreciate recommendations  - D/c'd fluids - f/u blood cultures(NGTD), HSV viral culture  - d/c antibiotics as cultures are negative, patient improving - Hep B vaccination - RPR negative - f/u CSF VDRL - f/u GC/chlaymidia - influenza panel --> negative  - Rapid drug screen -> negative  - Cryptococcal Ag--> negative  - rapid strep screen --> negative - fu Gi pathogen panel, CD4 count, HIV-1 RNA, quantiferon gold  2. Migraine-History of migraines in the past that were treated by a drug patient can't remember which was stopped when symptoms became intermittent. Current migraines likely exacerbated by infection and have improved since hospitalization.  - Continue to monitor  - Norco q 4 hours for pain.   Diet: Regular  DVT prophylaxis: Lovenox 40 mg BID  Droplet precautions  Dispo: Anticipated discharge in approximately 1-2 day(s). Could potential go home today pending approval from ID.  The patient does not have a current PCP (No Pcp Per Patient) and [facility] after discharge.  The patient does not have transportation limitations that hinder transportation to clinic appointments. .Services Needed at time of discharge: Y = Yes, Blank = No PT:   OT:   RN:   Equipment:   Other:     LOS: 2 days   Annia BeltKaleb I Keyserling, Med Student 11/21/2013, 8:10 AM   I have seen and examined the patient, and  reviewed the daily progress note by Camila LiKaleb Keyserling, MS 4 and discussed the care of the patient with them. Please see my progress note from 11/21/2013 for further details regarding assessment and plan.    Signed:  Rich Numberarly Saw Mendenhall, MD 11/21/2013, 10:11 AM

## 2013-11-21 NOTE — Discharge Summary (Signed)
Name: [name] [name] MRN: [id number] DOB: 14-Oct-1990 23 y.o. PCP: No Pcp Per Patient  Date of Admission: 11/19/2013 10:22 AM Date of Discharge: 11/21/2013 Attending Physician: Aletta Edouard, MD  Discharge Diagnosis: 1. Acute HIV Infection 2. Migraine  Discharge Medications:   Medication List    Notice   You have not been prescribed any medications.      Disposition and follow-up:   [name] [name] was discharged from [facility] in Good condition.  At the hospital follow up visit please address:  1.  New HIV diagnosis, HAART therapy.   2.  Labs / imaging needed at time of follow-up: None  3.  Pending labs/ test needing follow-up: CD4 count, viral load, genotyping, GC/Chlamydia, stool antigen.   Follow-up Appointments: Follow-up Information   Follow up with Cliffton Asters, MD On 12/14/2013. (at 3:30pm)    Specialty:  Infectious Diseases   Contact information:   [address] [place] [address] [phone]       Discharge Instructions: Discharge Instructions   Diet - low sodium heart healthy    Complete by:  As directed      Increase activity slowly    Complete by:  As directed            Consultations:  Infectious Disease, Dr. Orvan Falconer  Procedures Performed:  Ct Head Wo Contrast  11/19/2013   CLINICAL DATA:  Headache and nuchal rigidity  EXAM: CT HEAD WITHOUT CONTRAST  TECHNIQUE: Contiguous axial images were obtained from the base of the skull through the vertex without intravenous contrast.  COMPARISON:  None.  FINDINGS: The ventricles are normal in size and configuration. There is no mass, hemorrhage, extra-axial fluid collection, or midline shift. Gray-white compartments appear normal. No acute infarct is apparent. No meningeal lesions are identified on this noncontrast enhanced study.  Bony calvarium appears intact. The mastoid air cells are clear. There is mucosal thickening in both maxillary antra as well as in multiple  ethmoid air cells, more on the right than on the left. Mild mucosal thickening is also noted in the sphenoid sinus regions. Frontal sinuses are hypoplastic.  IMPRESSION: Multilevel paranasal sinus disease.  Study otherwise unremarkable.   Electronically Signed   By: Bretta Bang M.D.   On: 11/19/2013 11:38   Dg Chest Port 1 View  11/19/2013   CLINICAL DATA:  MIGRAINE LEG PAIN  EXAM: PORTABLE CHEST - 1 VIEW  COMPARISON:  None.  FINDINGS: Relatively low lung volumes. Lungs clear. Heart size upper limits normal. No effusion. Regional bones unremarkable.  IMPRESSION: No active disease.   Electronically Signed   By: Oley Balm M.D.   On: 11/19/2013 11:14    Admission HPI: Mr. [name] [name] is a 23yo man with PMHx of migraines, pseudoseizures, and anxiety who presents to the ED with nausea, vomiting, diarrhea and severe leg pain and weakness that started last night. Patient has chronic mild baseline leg weakness and pain but says over the past few days it has increased significantly. Last night patient reports having a subjective fever with shaking and chills. He had severe leg pain from his hips down to his feet. Pt describes having "too many BMs to count" last night and feeling so bad he had to "sleep in the bathroom". He has not taken any over the counter medications for his symptoms. His nausea has worsened today and he had one episode of emesis in the ambulance on his way to the hospital. He currently endorses having headaches  that are throbbing at times and similar to migraines he has had in the past. He endorses having nuchal rigidity that is worse today than yesterday. Patient denies any recent travel, new rashes, illness, or spending time in the woods. 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 He is sexually active with both males and females and states that he always uses protection.  On presentation to the ED, the patient had a fever of 102 with tachycardia and  tachypnea. His BP was in the 80s/40s, he had a WBC of 12, lactate of 3.2 and nuchal rigidity. He received 2L bolus of NS and was started on empiric vancomycin, acyclovir, rocephin, and ampicillin. He also received 1 times does of dexamethasone. Lumbar puncture was performed showing clear fluid with protein of 14, glucose of 79, occasional leukocytes, and negative for gram stain/Cryptococcal antigens. Blood cultures were drawn. Chest x-ray was normal with CT showing multilevel paranasal sinus disease, but otherwise normal.    Hospital Course by problem list:   1. Acute HIV Infection: On presentation patient had a fever of 102.8, was tachycardic to 127 , tachypnic to 27, hypotensive to 80s/40s with a WBC of 12. Lactic acid 3.2. Nuchal rigidity with associated headache was concerning for meningitis. However, lumbar puncture showed normal glucose, low protein, with normal gram stain and was negative for cryptococcal antigen making meningeal infection less likely. Pt had initial positive HIV Elisa with confirmatory positive antibody. His absolute lymphocyte count of 1.2 is in the low normal range. Patient was informed of his diagnosis yesterday, 7/27. Infectious disease saw pt yesterday 7/27 and discussed diagnosis with patient and with patient's permission, his parents. Likely acute presentation of initial HIV infection. Hepatitis panel was negative including surface antibody. Patient received Hepatitis A and B vaccines. Patient has improved greatly since arrival and vitals are stable. Waiting for CD4 count, viral load, and HIV genotype before making final decision about HAART therapy. Pt to be discharged today and has follow-up appointment with ID specialist, Dr. Orvan Falconerampbell, for August 20th at 3:30PM to discuss HAART therapy.   2. Migraine: History of migraines in the past that were treated by a drug patient can't remember which was stopped when symptoms became intermittent. Current migraines likely exacerbated by  infection and have improved since hospitalization. He denies any headaches currently.    Discharge Vitals:   BP 113/59  Pulse 64  Temp(Src) 97.7 F (36.5 C) (Oral)  Resp 18  Ht 5\' 7"  (1.702 m)  Wt 177 lb 9.6 oz (80.559 kg)  BMI 27.81 kg/m2  SpO2 97% Physical Exam  General: sitting up in bed, NAD, in better spirits  HEENT: /AT, PERRLA, no nuchal rigidity  Lungs: Clear to auscultation bilaterally, breaths nonlabored  Heart: RRR, normal S1 and S2 with no murmurs  Abdomen: +BS, non-distended, nontender  Extremities: no pain to palpation of lower extremities, no joint warmness/effusion  Skin: No new rashes or ulcers  Neurologic: A&Ox3, attentive to conversation, no focal deficits   Discharge Labs:  Results for orders placed during the hospital encounter of 11/19/13 (from the past 24 hour(s))  BASIC METABOLIC PANEL     Status: Abnormal   Collection Time    11/21/13  8:45 AM      Result Value Ref Range   Sodium 141  137 - 147 mEq/L   Potassium 3.7  3.7 - 5.3 mEq/L   Chloride 103  96 - 112 mEq/L   CO2 29  19 -  32 mEq/L   Glucose, Bld 95  70 - 99 mg/dL   BUN 5 (*) 6 - 23 mg/dL   Creatinine, Ser 1.19  0.50 - 1.35 mg/dL   Calcium 8.8  8.4 - 14.7 mg/dL   GFR calc non Af Amer >90  >90 mL/min   GFR calc Af Amer >90  >90 mL/min   Anion gap 9  5 - 15    Signed: Rich Number, MD 11/21/2013, 11:09 AM    Services Ordered on Discharge: None Equipment Ordered on Discharge: None

## 2013-11-22 ENCOUNTER — Telehealth: Payer: Self-pay | Admitting: Internal Medicine

## 2013-11-22 ENCOUNTER — Encounter (HOSPITAL_COMMUNITY): Payer: Self-pay | Admitting: Internal Medicine

## 2013-11-22 DIAGNOSIS — A039 Shigellosis, unspecified: Secondary | ICD-10-CM | POA: Diagnosis present

## 2013-11-22 LAB — CSF CULTURE W GRAM STAIN: Gram Stain: NONE SEEN

## 2013-11-22 LAB — HERPES SIMPLEX VIRUS(HSV) DNA BY PCR
HSV 1 DNA: NOT DETECTED
HSV 2 DNA: NOT DETECTED

## 2013-11-22 LAB — HIV-1 RNA ULTRAQUANT REFLEX TO GENTYP+
HIV 1 RNA Quant: 47494 copies/mL — ABNORMAL HIGH (ref <20)
HIV-1 RNA Quant, Log: 4.68 {Log} — ABNORMAL HIGH (ref <1.30)

## 2013-11-22 LAB — QUANTIFERON TB GOLD ASSAY (BLOOD)
INTERFERON GAMMA RELEASE ASSAY: NEGATIVE
Mitogen value: >10 IU/mL
QUANTIFERON NIL VALUE: 0.09 [IU]/mL
TB AG VALUE: 0.16 [IU]/mL
TB Antigen Minus Nil Value: 0.07 IU/mL

## 2013-11-22 LAB — CSF CULTURE: CULTURE: NO GROWTH

## 2013-11-22 NOTE — Telephone Encounter (Signed)
Patient contacted in regards to positive Shigella results. Patient was notified of results and that he should complete a 3 day course of Ciprofloxacin 500 mg BID. The prescription was sent to his pharmacy, [facility] on [address]

## 2013-11-22 NOTE — Discharge Summary (Signed)
INTERNAL MEDICINE ATTENDING DISCHARGE COSIGN   I evaluated the patient on the day of discharge and discussed the discharge plan with my resident team. I agree with the discharge documentation and disposition.   Aletta EdouardBHARDWAJ, Moselle Rister 11/22/2013, 3:26 PM

## 2013-11-23 MED ORDER — CIPROFLOXACIN HCL 500 MG PO TABS: 500.0000 mg | ORAL_TABLET | Freq: Two times a day (BID) | ORAL | Status: DC

## 2013-11-25 LAB — CULTURE, BLOOD (ROUTINE X 2)
CULTURE: NO GROWTH
Culture: NO GROWTH

## 2013-11-27 LAB — VIRAL CULTURE VIRC

## 2013-12-05 LAB — HIV-1 GENOTYPR PLUS

## 2013-12-14 ENCOUNTER — Ambulatory Visit: Payer: BC Managed Care – PPO | Admitting: Internal Medicine

## 2014-01-04 ENCOUNTER — Encounter: Payer: Self-pay | Admitting: Internal Medicine

## 2014-01-04 ENCOUNTER — Encounter: Payer: Self-pay | Admitting: *Deleted

## 2014-01-04 ENCOUNTER — Ambulatory Visit (INDEPENDENT_AMBULATORY_CARE_PROVIDER_SITE_OTHER): Payer: BC Managed Care – PPO | Admitting: Internal Medicine

## 2014-01-04 VITALS — Temp 97.7°F | Ht 67.0 in | Wt 169.5 lb

## 2014-01-04 DIAGNOSIS — Z23 Encounter for immunization: Secondary | ICD-10-CM | POA: Diagnosis not present

## 2014-01-04 DIAGNOSIS — B20 Human immunodeficiency virus [HIV] disease: Secondary | ICD-10-CM

## 2014-01-04 MED ORDER — ELVITEG-COBIC-EMTRICIT-TENOFDF 150-150-200-300 MG PO TABS: 1.0000 | ORAL_TABLET | Freq: Every day | ORAL | Status: DC

## 2014-01-04 NOTE — Progress Notes (Signed)
Patient ID: [name] [name], male   DOB: [birth year], 23 y.o.   MRN: [id number]          Patient Active Problem List   Diagnosis Date Noted  . HIV disease 11/20/2013    Priority: High  . Acute HIV infection syndrome 11/20/2013    Priority: High  . Fever 11/19/2013    Priority: High  . 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 Thrombocytopenia 11/20/2013  . H/O migraine 11/20/2013  . Anxiety disorder 11/20/2013  . Cigar smoker 11/20/2013    Patient's Medications  New Prescriptions   ELVITEGRAVIR-COBICISTAT-EMTRICITABINE-TENOFOVIR (STRIBILD) 150-150-200-300 MG TABS TABLET    Take 1 tablet by mouth daily with breakfast.  Previous Medications   No medications on file  Modified Medications   No medications on file  Discontinued Medications   CIPROFLOXACIN (CIPRO) 500 MG TABLET    Take 1 tablet (500 mg total) by mouth 2 (two) times daily.    Subjective: [name] [name] is in for his hospital followup visit. He has newly diagnosed HIV infection and may have had primary HIV putting him in the hospital with fever and headache. He is feeling much better. His fever and headache have resolved and he is very eager to start therapy. He lives in a college apartment with a roommate. He states that his roommate is very private and he does not see any problem with keeping his own medication a secret from his roommate. Review of Systems: Pertinent items are noted in HPI.  Past Medical History  Diagnosis Date  . Migraine   . Anxiety   . Psychiatric pseudoseizure   . Shigella dysentery     History  Substance Use Topics  . Smoking status: Current Some Day Smoker    Types: Cigars  . Smokeless tobacco: Not on file  . Alcohol Use: 2.0 oz/week    4 drink(s) per week    No family history on file.  No Known Allergies  Objective: Temp: 97.7 F (36.5 C) (09/10 1105) Temp src: Oral (09/10 1105) Body mass index is 26.54 kg/(m^2).  General: He is smiling and in much better spirits  than when he was in the hospital Oral: No oropharyngeal lesions Skin: No rash Lungs: Clear Cor: Regular S1 and S2 no murmurs  Lab Results Lab Results  Component Value Date   WBC 6.3 11/20/2013   HGB 13.3 11/20/2013   HCT 40.0 11/20/2013   MCV 93.5 11/20/2013   PLT 106* 11/20/2013    Lab Results  Component Value Date   CREATININE 0.70 11/21/2013   BUN 5* 11/21/2013   NA 141 11/21/2013   K 3.7 11/21/2013   CL 103 11/21/2013   CO2 29 11/21/2013    Lab Results  Component Value Date   ALT 21 11/19/2013   AST 29 11/19/2013   ALKPHOS 70 11/19/2013   BILITOT 0.8 11/19/2013    No results found for this basename: CHOL, HDL, LDLCALC, LDLDIRECT, TRIG, CHOLHDL    Lab Results HIV 1 RNA Quant (copies/mL)  Date Value  11/20/2013 47494*     CD4 T Cell Abs (/uL)  Date Value  11/20/2013 350*     Assessment: I have continued our discussion about HIV education and adherence tips. I reviewed treatment options and have recommended once daily Stribild with food.  Plan: 1. Start Stribild 2. Influenza vaccination 3. Followup in 3 weeks   Cliffton Asters, MD [facility] Infectious Disease [facility] [phone] pager   [phone] cell 01/04/2014, 11:26  AM

## 2014-01-24 ENCOUNTER — Encounter: Payer: Self-pay | Admitting: Internal Medicine

## 2014-01-24 ENCOUNTER — Ambulatory Visit (INDEPENDENT_AMBULATORY_CARE_PROVIDER_SITE_OTHER): Payer: BC Managed Care – PPO | Admitting: Internal Medicine

## 2014-01-24 VITALS — BP 125/76 | HR 95 | Temp 98.5°F | Wt 167.0 lb

## 2014-01-24 DIAGNOSIS — B20 Human immunodeficiency virus [HIV] disease: Secondary | ICD-10-CM

## 2014-01-24 LAB — CBC
HCT: 42.4 % (ref 39.0–52.0)
Hemoglobin: 14.5 g/dL (ref 13.0–17.0)
MCH: 31.5 pg (ref 26.0–34.0)
MCHC: 34.2 g/dL (ref 30.0–36.0)
MCV: 92.2 fL (ref 78.0–100.0)
Platelets: 118 10*3/uL — ABNORMAL LOW (ref 150–400)
RBC: 4.6 MIL/uL (ref 4.22–5.81)
RDW: 13.4 % (ref 11.5–15.5)
WBC: 6.6 10*3/uL (ref 4.0–10.5)

## 2014-01-24 LAB — COMPREHENSIVE METABOLIC PANEL
ALT: 16 U/L (ref 0–53)
AST: 15 U/L (ref 0–37)
Albumin: 4.3 g/dL (ref 3.5–5.2)
Alkaline Phosphatase: 69 U/L (ref 39–117)
BUN: 10 mg/dL (ref 6–23)
CALCIUM: 9.8 mg/dL (ref 8.4–10.5)
CO2: 25 mEq/L (ref 19–32)
CREATININE: 1.08 mg/dL (ref 0.50–1.35)
Chloride: 104 mEq/L (ref 96–112)
Glucose, Bld: 108 mg/dL — ABNORMAL HIGH (ref 70–99)
Potassium: 3.7 mEq/L (ref 3.5–5.3)
Sodium: 139 mEq/L (ref 135–145)
Total Bilirubin: 1.1 mg/dL (ref 0.2–1.2)
Total Protein: 8.3 g/dL (ref 6.0–8.3)

## 2014-01-24 NOTE — Progress Notes (Signed)
Patient ID: [name] [name], male   DOB: 03-Mar-1991, 23 y.o.   MRN: [id number]          Patient Active Problem List   Diagnosis Date Noted  . HIV disease 11/20/2013    Priority: High  . Acute HIV infection syndrome 11/20/2013    Priority: High  . 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 Thrombocytopenia 11/20/2013  . H/O migraine 11/20/2013  . Anxiety disorder 11/20/2013  . Cigar smoker 11/20/2013    Patient's Medications  New Prescriptions   No medications on file  Previous Medications   ELVITEGRAVIR-COBICISTAT-EMTRICITABINE-TENOFOVIR (STRIBILD) 150-150-200-300 MG TABS TABLET    Take 1 tablet by mouth daily with breakfast.  Modified Medications   No medications on file  Discontinued Medications   No medications on file    Subjective: [name] [name] is in for his followup visit. He started Stribild after his last visit and has not missed a single dose. He takes it in the morning with food. He has been tolerating it well. Yesterday he developed some left-sided abdominal pain. He did not have any nausea, vomiting, diarrhea, constipation, reflux symptoms or fever. The pain lasted overnight but has improved this morning. He has been able to eat. Review of Systems: Pertinent items are noted in HPI.  Past Medical History  Diagnosis Date  . Migraine   . Anxiety   . Psychiatric pseudoseizure   . Shigella dysentery     History  Substance Use Topics  . Smoking status: Current Some Day Smoker    Types: Cigars  . Smokeless tobacco: Never Used  . Alcohol Use: 2.0 oz/week    4 drink(s) per week    No family history on file.  No Known Allergies  Objective: Temp: 98.5 F (36.9 C) (09/30 1102) Temp src: Oral (09/30 1102) BP: 125/76 mmHg (09/30 1102) Pulse Rate: 95 (09/30 1102) Body mass index is 26.15 kg/(m^2).  General: He is in good spirits and in no distress Oral: No oropharyngeal lesions Skin: No rash Lungs: Clear Cor: Regular S1 and S2 with no  murmurs Abdomen: Soft and nontender with no palpable masses  Lab Results Lab Results  Component Value Date   WBC 6.3 11/20/2013   HGB 13.3 11/20/2013   HCT 40.0 11/20/2013   MCV 93.5 11/20/2013   PLT 106* 11/20/2013    Lab Results  Component Value Date   CREATININE 0.70 11/21/2013   BUN 5* 11/21/2013   NA 141 11/21/2013   K 3.7 11/21/2013   CL 103 11/21/2013   CO2 29 11/21/2013    Lab Results  Component Value Date   ALT 21 11/19/2013   AST 29 11/19/2013   ALKPHOS 70 11/19/2013   BILITOT 0.8 11/19/2013    No results found for this basename: CHOL, HDL, LDLCALC, LDLDIRECT, TRIG, CHOLHDL    Lab Results HIV 1 RNA Quant (copies/mL)  Date Value  11/20/2013 47494*     CD4 T Cell Abs (/uL)  Date Value  11/20/2013 350*     Assessment: He is off to a good start with his antiretroviral therapy. I will continue Stribild and repeat lab work today. I do not know the cause of his recent abdominal pain but is appears to be resolving spontaneously.  Plan: 1. Continue Stribild 2. Check lab work today 3. Followup in 4 weeks   Cliffton AstersJohn Elton Heid, MD [facility] [phone] pager   [phone] cell 01/24/2014, 11:12 AM

## 2014-01-25 LAB — HIV-1 RNA QUANT-NO REFLEX-BLD
HIV 1 RNA Quant: <20 copies/mL (ref <20)
HIV-1 RNA Quant, Log: <1.3 {Log} (ref <1.30)

## 2014-01-25 LAB — T-HELPER CELL (CD4) - (RCID CLINIC ONLY)
CD4 T CELL HELPER: 21 % — AB (ref 33–55)
CD4 T Cell Abs: 290 /uL — ABNORMAL LOW (ref 400–2700)

## 2014-01-30 ENCOUNTER — Encounter: Payer: Self-pay | Admitting: *Deleted

## 2014-03-06 ENCOUNTER — Ambulatory Visit: Payer: BC Managed Care – PPO | Admitting: Internal Medicine

## 2014-03-06 ENCOUNTER — Telehealth: Payer: Self-pay | Admitting: *Deleted

## 2014-03-06 NOTE — Telephone Encounter (Signed)
Needs to make a new appt w/ Dr. Orvan Falconerampbell.

## 2014-03-20 ENCOUNTER — Encounter: Payer: Self-pay | Admitting: Internal Medicine

## 2014-03-20 ENCOUNTER — Ambulatory Visit (INDEPENDENT_AMBULATORY_CARE_PROVIDER_SITE_OTHER): Payer: BC Managed Care – PPO | Admitting: Internal Medicine

## 2014-03-20 DIAGNOSIS — B20 Human immunodeficiency virus [HIV] disease: Secondary | ICD-10-CM | POA: Diagnosis not present

## 2014-03-20 LAB — RPR
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
## 2014-03-21 LAB — HIV-1 RNA QUANT-NO REFLEX-BLD: HIV-1 RNA Quant, Log: <1.3 {Log} (ref <1.30)

## 2014-03-21 LAB — T-HELPER CELL (CD4) - (RCID CLINIC ONLY)
CD4 T CELL HELPER: 27 % — AB (ref 33–55)
CD4 T Cell Abs: 440 /uL (ref 400–2700)

## 2014-05-07 ENCOUNTER — Telehealth: Payer: Self-pay | Admitting: *Deleted

## 2014-05-07 NOTE — Telephone Encounter (Signed)
Received a call from Shon HaleLeon wanting a new co-pay card.  I left it up front with Val to give patient when he comes in.

## 2014-06-05 ENCOUNTER — Ambulatory Visit (INDEPENDENT_AMBULATORY_CARE_PROVIDER_SITE_OTHER): Payer: BLUE CROSS/BLUE SHIELD | Admitting: Internal Medicine

## 2014-06-05 ENCOUNTER — Encounter: Payer: Self-pay | Admitting: *Deleted

## 2014-06-05 ENCOUNTER — Encounter: Payer: Self-pay | Admitting: Internal Medicine

## 2014-06-05 VITALS — BP 132/82 | HR 90 | Temp 98.1°F | Wt 158.5 lb

## 2014-06-05 DIAGNOSIS — B20 Human immunodeficiency virus [HIV] disease: Secondary | ICD-10-CM

## 2014-06-05 DIAGNOSIS — Z23 Encounter for immunization: Secondary | ICD-10-CM

## 2014-06-05 MED ORDER — ELVITEG-COBIC-EMTRICIT-TENOFAF 150-150-200-10 MG PO TABS: 1.0000 | ORAL_TABLET | Freq: Every day | ORAL | Status: DC

## 2014-06-05 NOTE — Progress Notes (Signed)
Patient ID: [name] [name], male   DOB: 1990/04/30, 24 y.o.   MRN: [id number]          Patient Active Problem List   Diagnosis Date Noted  . HIV disease 11/20/2013    Priority: High  . Acute HIV infection syndrome 11/20/2013    Priority: High  . 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 Thrombocytopenia 11/20/2013  . H/O migraine 11/20/2013  . Anxiety disorder 11/20/2013  . Cigar smoker 11/20/2013    Patient's Medications  New Prescriptions   ELVITEGRAVIR-COBICISTAT-EMTRICITABINE-TENOFOVIR (GENVOYA) 150-150-200-10 MG TABS TABLET    Take 1 tablet by mouth daily with breakfast.  Previous Medications   No medications on file  Modified Medications   No medications on file  Discontinued Medications   ELVITEGRAVIR-COBICISTAT-EMTRICITABINE-TENOFOVIR (STRIBILD) 150-150-200-300 MG TABS TABLET    Take 1 tablet by mouth daily with breakfast.    Subjective: [name] [name] is in for his routine visit. He had to miss Stribild for 3 days in a row when his co-pay card expired at the end of December. He was able to get a new co-pay card and restart the medication. He is taking it correctly and has no trouble tolerating it. He is doing well and looking forward to graduating from school this coming December. 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 He still section active with his partner but uses condoms.  Review of Systems: Pertinent items are noted in HPI.  Past Medical History  Diagnosis Date  . Migraine   . Anxiety   . Psychiatric pseudoseizure   . Shigella dysentery     History  Substance Use Topics  . Smoking status: Current Some Day Smoker    Types: Cigars  . Smokeless tobacco: Never Used     Comment: 2 cigars a week  . Alcohol Use: 3.6 oz/week    6 Shots of liquor per week    No family history on file.  No Known Allergies  Objective: Temp: 98.1 F (36.7 C) (02/09 0957) Temp Source: Oral (02/09 0957) BP: 132/82 mmHg (02/09  0957) Pulse Rate: 90 (02/09 0957) Body mass index is 24.82 kg/(m^2).  General: He is smiling and in good spirits Oral: No oropharyngeal lesions. Teeth are in good condition Skin: Clear Lungs: Clear Cor: Regular S1 and S2 with no murmur  Lab Results Lab Results  Component Value Date   WBC 6.6 01/24/2014   HGB 14.5 01/24/2014   HCT 42.4 01/24/2014   MCV 92.2 01/24/2014   PLT 118* 01/24/2014    Lab Results  Component Value Date   CREATININE 1.08 01/24/2014   BUN 10 01/24/2014   NA 139 01/24/2014   K 3.7 01/24/2014   CL 104 01/24/2014   CO2 25 01/24/2014    Lab Results  Component Value Date   ALT 16 01/24/2014   AST 15 01/24/2014   ALKPHOS 69 01/24/2014   BILITOT 1.1 01/24/2014    No results found for: CHOL, HDL, LDLCALC, LDLDIRECT, TRIG, CHOLHDL  Lab Results HIV 1 RNA QUANT (copies/mL)  Date Value  03/20/2014 <20  01/24/2014 <20  11/20/2013 47494*   CD4 T CELL ABS (/uL)  Date Value  03/20/2014 440  01/24/2014 290*  11/20/2013 350*     Assessment: His HIV infection has come under excellent control.  Plan: 1. Change Stribild to new preparation called Genvoya 2. Prevention for positive counseling provided 3. Follow-up after lab work in 3 months   Cliffton Asters, MD  [facility] [phone] pager   [phone] cell 06/05/2014, 10:35 AM

## 2014-06-06 ENCOUNTER — Other Ambulatory Visit: Payer: Self-pay | Admitting: *Deleted

## 2014-06-06 DIAGNOSIS — B20 Human immunodeficiency virus [HIV] disease: Secondary | ICD-10-CM

## 2014-06-06 MED ORDER — ELVITEG-COBIC-EMTRICIT-TENOFDF 150-150-200-300 MG PO TABS: 1.0000 | ORAL_TABLET | Freq: Every day | ORAL | Status: DC

## 2014-06-06 NOTE — Telephone Encounter (Signed)
Genvoya not covered by pt's health insurance.  Per Dr. Orvan Falconerampbell, OK to change pt back to previous HIV rx, Stribild, once daily by mouth.

## 2014-06-20 ENCOUNTER — Emergency Department (INDEPENDENT_AMBULATORY_CARE_PROVIDER_SITE_OTHER): Payer: BLUE CROSS/BLUE SHIELD

## 2014-06-20 ENCOUNTER — Encounter (HOSPITAL_COMMUNITY): Payer: Self-pay

## 2014-06-20 ENCOUNTER — Emergency Department (INDEPENDENT_AMBULATORY_CARE_PROVIDER_SITE_OTHER)
Admission: EM | Admit: 2014-06-20 | Discharge: 2014-06-20 | Disposition: A | Discharge status: Home / Self Care | Payer: Self-pay | Source: Home / Self Care | Attending: Emergency Medicine | Admitting: Emergency Medicine

## 2014-06-20 DIAGNOSIS — S39012A Strain of muscle, fascia and tendon of lower back, initial encounter: Secondary | ICD-10-CM

## 2014-06-20 DIAGNOSIS — S8392XA Sprain of unspecified site of left knee, initial encounter: Secondary | ICD-10-CM

## 2014-06-20 MED ORDER — HYDROCODONE-ACETAMINOPHEN 5-325 MG PO TABS: ORAL_TABLET | ORAL | Status: DC

## 2014-06-20 MED ORDER — IBUPROFEN 800 MG PO TABS
ORAL_TABLET | ORAL | Status: AC
  Filled 2014-06-20: qty 1

## 2014-06-20 MED ORDER — NAPROXEN 500 MG PO TABS: 500.0000 mg | ORAL_TABLET | Freq: Two times a day (BID) | ORAL | Status: DC

## 2014-06-20 MED ORDER — IBUPROFEN 800 MG PO TABS
800.0000 mg | ORAL_TABLET | Freq: Once | ORAL | Status: AC
  Administered 2014-06-20: 800 mg via ORAL

## 2014-06-20 MED ORDER — TIZANIDINE HCL 4 MG PO CAPS: 4.0000 mg | ORAL_CAPSULE | Freq: Three times a day (TID) | ORAL | Status: DC
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
## 2014-06-20 NOTE — Discharge Instructions (Signed)
Do exercises twice daily followed by moist heat for 15 minutes.      Try to be as active as possible.  If no better in 2 weeks, follow up with orthopedist.   Knee pain can be caused by many conditions:  Osteoarthritis, gout, bursitis, tendonitis, cartiledge damage, condromalacia patella, patellofemoral syndrome, and ligament sprain to name just a few.  Often some simple conservative measures can help alleviate the pain.  Do not do the following:  Avoid squatting and doing deep knee bends.  This puts too much of load on your cartiledges and tendons.  If you do a knee bend, go only half way down, flexing your knee no more than 90 degrees.  Do the following:  If you are overweight or obese, lose weight.  This makes for a lot less load on your knee joints.  If you use tobacco, quit.  Nicotine causes spasm of the small arteries, decreases blood flow, and impairs your body's normal ability to repair damage.  If your knee is acutely inflamed, use the principles of RICE (rest, ice, compression, and elevation).  Wearing a knee brace can help.  These are usually made of neoprene and can be purchased over the counter at the drug store.  Use of over the counter pain meds can be of help.  Tylenol (or acetaminophen) is the safest to use.  It often helps to take this regularly.  You can take up to 2 325 mg tablets 5 times daily, but it best to start out much lower that that, perhaps 2 325 mg tablets twice daily, then increase from there. People who are on the blood thinner warfarin have to be careful about taking high doses of Tylenol.  For people who are able to tolerate them, ibuprofen and naproxyn can also help with the pain.  You should discuss these agents with your physician before taking them.  People with chronic kidney disease, hypertension, peptic ulcer disease, and reflux can suffer adverse side effects. They should not be taken with warfarin. The maximum dosage of ibuprofen is 800 mg 3 times  daily with meals.  The maximum dosage of naprosyn is 2 and 1/2 tablets twice daily with food, but again, start out low and gradually increase the dose until adequate pain relief is achieved. Ibuprofen and naprosyn should always be taken with food.  People with cartiledge injury or osteoarthritis may find glucosamine to be helpful.  This is an over-the-counter supplement that helps nourish and repair cartiledge.  The dose is 500 mg 3 times daily or 1500 mg taken in a single dose. This can take several months to work and it doesn't always work.    For people with knee pain on just one side, use of a cane held in the hand on the same side as the knee pain takes some of the stress off the knee joint and can make a big difference in knee pain.  Wearing good shoes with adequate arch support is essential.  Regular exercise is of utmost importance.  Swimming, water aerobics, or use of an elliptical exerciser put the least stress on the knees of any exercise.  Finally doing the exercises below can be very helpful.  They tend to strengthen the muscles around the knee and provide extra support and stability.  Try to do them twice a day followed by ice for 10 minutes.
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
## 2014-10-02 ENCOUNTER — Other Ambulatory Visit: Payer: BLUE CROSS/BLUE SHIELD

## 2014-10-04 ENCOUNTER — Other Ambulatory Visit: Payer: BLUE CROSS/BLUE SHIELD

## 2014-10-08 ENCOUNTER — Other Ambulatory Visit: Payer: BLUE CROSS/BLUE SHIELD

## 2014-10-16 ENCOUNTER — Ambulatory Visit: Payer: BLUE CROSS/BLUE SHIELD | Admitting: Internal Medicine

## 2014-10-18 ENCOUNTER — Other Ambulatory Visit: Payer: BLUE CROSS/BLUE SHIELD

## 2014-10-18 DIAGNOSIS — B20 Human immunodeficiency virus [HIV] disease: Secondary | ICD-10-CM

## 2014-10-18 LAB — COMPREHENSIVE METABOLIC PANEL
ALT: 16 U/L (ref 0–53)
AST: 14 U/L (ref 0–37)
Albumin: 4.3 g/dL (ref 3.5–5.2)
Alkaline Phosphatase: 95 U/L (ref 39–117)
BILIRUBIN TOTAL: 0.3 mg/dL (ref 0.2–1.2)
BUN: 17 mg/dL (ref 6–23)
CO2: 24 mEq/L (ref 19–32)
Calcium: 9.6 mg/dL (ref 8.4–10.5)
Chloride: 105 mEq/L (ref 96–112)
Creat: 1.13 mg/dL (ref 0.50–1.35)
Glucose, Bld: 163 mg/dL — ABNORMAL HIGH (ref 70–99)
Potassium: 3.9 mEq/L (ref 3.5–5.3)
Sodium: 139 mEq/L (ref 135–145)
Total Protein: 7.4 g/dL (ref 6.0–8.3)

## 2014-10-18 LAB — CBC
HCT: 43.4 % (ref 39.0–52.0)
HEMOGLOBIN: 14.6 g/dL (ref 13.0–17.0)
MCH: 33 pg (ref 26.0–34.0)
MCHC: 33.6 g/dL (ref 30.0–36.0)
MCV: 98 fL (ref 78.0–100.0)
MPV: 13.3 fL — ABNORMAL HIGH (ref 8.6–12.4)
Platelets: 159 10*3/uL (ref 150–400)
RBC: 4.43 MIL/uL (ref 4.22–5.81)
RDW: 12.5 % (ref 11.5–15.5)
WBC: 6.4 10*3/uL (ref 4.0–10.5)

## 2014-10-18 LAB — LIPID PANEL
CHOLESTEROL: 120 mg/dL (ref 0–200)
HDL: 31 mg/dL — AB (ref >=40)
LDL CALC: 49 mg/dL (ref 0–99)
Total CHOL/HDL Ratio: 3.9 Ratio
Triglycerides: 198 mg/dL — ABNORMAL HIGH (ref <150)
VLDL: 40 mg/dL (ref 0–40)

## 2014-10-18 LAB — RPR

## 2014-10-19 LAB — T-HELPER CELL (CD4) - (RCID CLINIC ONLY)
CD4 % Helper T Cell: 26 % — ABNORMAL LOW (ref 33–55)
CD4 T Cell Abs: 480 /uL (ref 400–2700)

## 2014-10-21 LAB — HIV-1 RNA QUANT-NO REFLEX-BLD: HIV 1 RNA Quant: <20 copies/mL (ref <20)

## 2014-10-24 ENCOUNTER — Ambulatory Visit (INDEPENDENT_AMBULATORY_CARE_PROVIDER_SITE_OTHER): Payer: BLUE CROSS/BLUE SHIELD | Admitting: Internal Medicine

## 2014-10-24 ENCOUNTER — Encounter: Payer: Self-pay | Admitting: Internal Medicine

## 2014-10-24 VITALS — BP 123/75 | HR 81 | Temp 98.6°F | Wt 161.0 lb

## 2014-10-24 DIAGNOSIS — R739 Hyperglycemia, unspecified: Secondary | ICD-10-CM | POA: Diagnosis not present

## 2014-10-24 DIAGNOSIS — B20 Human immunodeficiency virus [HIV] disease: Secondary | ICD-10-CM | POA: Diagnosis not present

## 2014-10-24 LAB — HEMOGLOBIN A1C
Hgb A1c MFr Bld: 4.7 % (ref <5.7)
Mean Plasma Glucose: 88 mg/dL (ref <117)

## 2014-10-24 MED ORDER — ELVITEG-COBIC-EMTRICIT-TENOFAF 150-150-200-10 MG PO TABS: 1.0000 | ORAL_TABLET | Freq: Every day | ORAL | Status: DC

## 2014-10-24 NOTE — Progress Notes (Signed)
Patient ID: [name] [name], male   DOB: 1991-03-06, 24 y.o.   MRN: [id number]          Patient Active Problem List   Diagnosis Date Noted  . HIV disease 11/20/2013    Priority: High  . Acute HIV infection syndrome 11/20/2013    Priority: High  . Hyperglycemia 10/24/2014  . 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 Thrombocytopenia 11/20/2013  . H/O migraine 11/20/2013  . Anxiety disorder 11/20/2013  . Cigar smoker 11/20/2013    Patient's Medications  New Prescriptions   ELVITEGRAVIR-COBICISTAT-EMTRICITABINE-TENOFOVIR (GENVOYA) 150-150-200-10 MG TABS TABLET    Take 1 tablet by mouth daily with breakfast.  Previous Medications   No medications on file  Modified Medications   No medications on file  Discontinued Medications   ELVITEGRAVIR-COBICISTAT-EMTRICITABINE-TENOFOVIR (STRIBILD) 150-150-200-300 MG TABS TABLET    Take 1 tablet by mouth daily with breakfast.   HYDROCODONE-ACETAMINOPHEN (NORCO/VICODIN) 5-325 MG PER TABLET    1 to 2 tabs every 4 to 6 hours as needed for pain.   NAPROXEN (NAPROSYN) 500 MG TABLET    Take 1 tablet (500 mg total) by mouth 2 (two) times daily.   TIZANIDINE (ZANAFLEX) 4 MG CAPSULE    Take 1 capsule (4 mg total) by mouth 3 (three) times daily.    Subjective: [name] [name] is in for his routine visit. He has not missed any doses of his Stribild since his last visit. He takes it in the morning with a meal. He has no problems tolerating it. He is not on any other medications. 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 He is out of school for the summer. He will return to school in the fall with plans to graduate in December. He has no current plans to move any time soon.  Review of Systems: His review of systems is unremarkable except that he notes recent mild headaches after he started back running. This occurred after he ran when it was quite hot.  Past Medical History  Diagnosis Date  . Migraine   . Anxiety   . Psychiatric  pseudoseizure   . Shigella dysentery     History  Substance Use Topics  . Smoking status: Current Some Day Smoker    Types: Cigars  . Smokeless tobacco: Never Used     Comment: 2 cigars a week  . Alcohol Use: 3.6 oz/week    6 Shots of liquor per week    Family History  Problem Relation Age of Onset  . Diabetes Sister   . Diabetes Maternal Grandmother   . Hypothyroidism Mother   . Diabetes Maternal Grandfather     No Known Allergies  Objective: Temp: 98.6 F (37 C) (06/29 1518) Temp Source: Oral (06/29 1518) BP: 123/75 mmHg (06/29 1518) Pulse Rate: 81 (06/29 1518) Body mass index is 25.21 kg/(m^2).  General: He is smiling and in good spirits. His weight is unchanged at 167 pounds Oral: No oropharyngeal lesions. Teeth are in good condition Skin: Clear Lungs: Clear Cor: Regular S1 and S2 with no murmur  Lab Results Lab Results  Component Value Date   WBC 6.4 10/18/2014   HGB 14.6 10/18/2014   HCT 43.4 10/18/2014   MCV 98.0 10/18/2014   PLT 159 10/18/2014    Lab Results  Component Value Date   CREATININE 1.13 10/18/2014   BUN 17 10/18/2014   NA 139 10/18/2014   K 3.9 10/18/2014   CL 105 10/18/2014   CO2 24 10/18/2014  Lab Results  Component Value Date   ALT 16 10/18/2014   AST 14 10/18/2014   ALKPHOS 95 10/18/2014   BILITOT 0.3 10/18/2014    Lab Results  Component Value Date   CHOL 120 10/18/2014   HDL 31* 10/18/2014   LDLCALC 49 10/18/2014   TRIG 198* 10/18/2014   CHOLHDL 3.9 10/18/2014    Lab Results HIV 1 RNA QUANT (copies/mL)  Date Value  10/18/2014 <20  03/20/2014 <20  01/24/2014 <20   CD4 T CELL ABS (/uL)  Date Value  10/18/2014 480  03/20/2014 440  01/24/2014 290*     Assessment: His HIV infection has come under excellent control. I will change him to the new preparation called Genvoya.  He has developed worsening hyperglycemia. He has a strong family history of diabetes. I encouraged him to continue getting regular  exercise. I will recheck his glucose and obtain a hemoglobin A1c today and see him back in the next few weeks.  He is still smoking cigars 2-3 times each week. He is contemplating quitting.  Plan: 1. Change Stribild to new preparation called Genvoya 2. Check basic metabolic panel and hemoglobin X3K 3. Cigarette cessation counseling provided 4. Follow-up in 3-4 weeks   Cliffton Asters, MD [facility] [phone] pager   [phone] cell 10/24/2014, 3:40 PM

## 2014-10-24 NOTE — Addendum Note (Signed)
Addended by: Mariea ClontsGREEN, Markelle Asaro D on: 10/24/2014 03:55 PM   Modules accepted: Orders

## 2014-10-25 LAB — BASIC METABOLIC PANEL
BUN: 12 mg/dL (ref 6–23)
CALCIUM: 10 mg/dL (ref 8.4–10.5)
CHLORIDE: 105 meq/L (ref 96–112)
CO2: 27 mEq/L (ref 19–32)
CREATININE: 1.04 mg/dL (ref 0.50–1.35)
Glucose, Bld: 111 mg/dL — ABNORMAL HIGH (ref 70–99)
POTASSIUM: 4 meq/L (ref 3.5–5.3)
SODIUM: 140 meq/L (ref 135–145)

## 2014-11-21 ENCOUNTER — Encounter: Payer: Self-pay | Admitting: Internal Medicine

## 2014-11-21 ENCOUNTER — Ambulatory Visit (INDEPENDENT_AMBULATORY_CARE_PROVIDER_SITE_OTHER): Payer: BLUE CROSS/BLUE SHIELD | Admitting: Internal Medicine

## 2014-11-21 VITALS — BP 119/78 | HR 67 | Temp 97.6°F | Wt 161.5 lb

## 2014-11-21 DIAGNOSIS — E785 Hyperlipidemia, unspecified: Secondary | ICD-10-CM | POA: Insufficient documentation

## 2014-11-21 DIAGNOSIS — B20 Human immunodeficiency virus [HIV] disease: Secondary | ICD-10-CM | POA: Diagnosis not present

## 2014-11-21 NOTE — Progress Notes (Signed)
Patient ID: [name] [name], male   DOB: 05/09/1990, 24 y.o.   MRN: [id number]          Patient Active Problem List   Diagnosis Date Noted  . HIV disease 11/20/2013    Priority: High  . Acute HIV infection syndrome 11/20/2013    Priority: High  . Dyslipidemia 11/21/2014  . Hyperglycemia 10/24/2014  . 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 H/O migraine 11/20/2013  . Anxiety disorder 11/20/2013  . Cigar smoker 11/20/2013    Patient's Medications  New Prescriptions   No medications on file  Previous Medications   ELVITEGRAVIR-COBICISTAT-EMTRICITABINE-TENOFOVIR (GENVOYA) 150-150-200-10 MG TABS TABLET    Take 1 tablet by mouth daily with breakfast.  Modified Medications   No medications on file  Discontinued Medications   No medications on file    Subjective: [name] [name] is in for his routine HIV follow-up visit. He had some diarrhea after he switch to [facility] at the time of his last visit one month ago but it resolved several days ago. He has not missed any doses. He takes it each morning with food. He is continue getting regular exercise running. He is feeling well. He is no longer working and has about 3-4 weeks before he starts back to school. As a result he says he has been smoking cigars more frequently. He has been thinking about quitting but has no current plan to quit. 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 He will be working and is finishing up his class work with a Higher education careers adviser to graduate in December.   Review of Systems: His review of systems is negative  Past Medical History  Diagnosis Date  . Migraine   . Anxiety   . Psychiatric pseudoseizure   . Shigella dysentery     History  Substance Use Topics  . Smoking status: Current Some Day Smoker    Types: Cigars  . Smokeless tobacco: Never Used     Comment: 2 cigars a week  . Alcohol Use: 3.6 oz/week    6 Shots of liquor per week    Family History  Problem  Relation Age of Onset  . Diabetes Sister   . Diabetes Maternal Grandmother   . Hypothyroidism Mother   . Diabetes Maternal Grandfather     No Known Allergies  Objective: Temp: 97.6 F (36.4 C) (07/27 1526) Temp Source: Oral (07/27 1526) BP: 119/78 mmHg (07/27 1526) Pulse Rate: 67 (07/27 1526) Body mass index is 25.29 kg/(m^2).  General: He is smiling and in good spirits. His weight is unchanged at 161 pounds Oral: No oropharyngeal lesions. Teeth are in good condition Skin: Clear Lungs: Clear Cor: Regular S1 and S2 with no murmur  Lab Results Lab Results  Component Value Date   WBC 6.4 10/18/2014   HGB 14.6 10/18/2014   HCT 43.4 10/18/2014   MCV 98.0 10/18/2014   PLT 159 10/18/2014    Lab Results  Component Value Date   CREATININE 1.04 10/24/2014   BUN 12 10/24/2014   NA 140 10/24/2014   K 4.0 10/24/2014   CL 105 10/24/2014   CO2 27 10/24/2014    Lab Results  Component Value Date   ALT 16 10/18/2014   AST 14 10/18/2014   ALKPHOS 95 10/18/2014   BILITOT 0.3 10/18/2014    Lab Results  Component Value Date   CHOL 120 10/18/2014   HDL 31* 10/18/2014   LDLCALC 49 10/18/2014   TRIG 198* 10/18/2014  CHOLHDL 3.9 10/18/2014   Hemoglobin A1c 10/24/2014: 4.7  Lab Results HIV 1 RNA QUANT (copies/mL)  Date Value  10/18/2014 <20  03/20/2014 <20  01/24/2014 <20   CD4 T CELL ABS (/uL)  Date Value  10/18/2014 480  03/20/2014 440  01/24/2014 290*     Assessment: His HIV infection has come under excellent control. I will continue Genvoya.  He has mild, persistent hyperglycemia and a family history of diabetes but his hemoglobin A1c is normal. This will need regular monitoring. I encouraged him to continue regular exercise.  He is still smoking cigars 2-3 times each week. I encouraged him to work hard on developing a plan to quit.  Plan: 1. Continue Genvoya 2. Cigar cessation counseling provided 3. Follow-up after blood work in 6 months   Cliffton Asters, MD [facility] [phone] pager   [phone] cell 11/21/2014, 3:56 PM
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
## 2015-06-05 ENCOUNTER — Other Ambulatory Visit: Payer: BLUE CROSS/BLUE SHIELD

## 2015-06-05 DIAGNOSIS — B20 Human immunodeficiency virus [HIV] disease: Secondary | ICD-10-CM

## 2015-06-05 LAB — CBC
HCT: 44.1 % (ref 39.0–52.0)
Hemoglobin: 14.6 g/dL (ref 13.0–17.0)
MCH: 32.5 pg (ref 26.0–34.0)
MCHC: 33.1 g/dL (ref 30.0–36.0)
MCV: 98.2 fL (ref 78.0–100.0)
MPV: 12.5 fL — ABNORMAL HIGH (ref 8.6–12.4)
Platelets: 188 K/uL (ref 150–400)
RBC: 4.49 MIL/uL (ref 4.22–5.81)
RDW: 13.1 % (ref 11.5–15.5)
WBC: 4.3 K/uL (ref 4.0–10.5)

## 2015-06-05 LAB — COMPREHENSIVE METABOLIC PANEL WITH GFR
ALT: 22 U/L (ref 9–46)
AST: 17 U/L (ref 10–40)
Albumin: 4 g/dL (ref 3.6–5.1)
Alkaline Phosphatase: 78 U/L (ref 40–115)
BUN: 11 mg/dL (ref 7–25)
CO2: 25 mmol/L (ref 20–31)
Calcium: 9.3 mg/dL (ref 8.6–10.3)
Chloride: 103 mmol/L (ref 98–110)
Creat: 1.14 mg/dL (ref 0.60–1.35)
Glucose, Bld: 105 mg/dL — ABNORMAL HIGH (ref 65–99)
Potassium: 4.2 mmol/L (ref 3.5–5.3)
Sodium: 138 mmol/L (ref 135–146)
Total Bilirubin: 0.6 mg/dL (ref 0.2–1.2)
Total Protein: 7.5 g/dL (ref 6.1–8.1)

## 2015-06-05 LAB — LIPID PANEL
CHOL/HDL RATIO: 4 ratio (ref <=5.0)
CHOLESTEROL: 156 mg/dL (ref 125–200)
HDL: 39 mg/dL — ABNORMAL LOW (ref >=40)
LDL Cholesterol: 95 mg/dL (ref <130)
Triglycerides: 111 mg/dL (ref <150)
VLDL: 22 mg/dL (ref <30)

## 2015-06-06 LAB — HIV-1 RNA QUANT-NO REFLEX-BLD

## 2015-06-06 LAB — T-HELPER CELL (CD4) - (RCID CLINIC ONLY)
CD4 % Helper T Cell: 32 % — ABNORMAL LOW (ref 33–55)
CD4 T CELL ABS: 570 /uL (ref 400–2700)

## 2015-06-06 LAB — RPR

## 2015-06-18 ENCOUNTER — Ambulatory Visit: Payer: BLUE CROSS/BLUE SHIELD | Admitting: Internal Medicine

## 2015-07-16 ENCOUNTER — Ambulatory Visit: Payer: BLUE CROSS/BLUE SHIELD | Admitting: Internal Medicine

## 2015-07-16 ENCOUNTER — Telehealth: Payer: Self-pay | Admitting: *Deleted

## 2015-07-16 NOTE — Telephone Encounter (Signed)
Contacted pt.  Very busy work schedule, forgot appt.  Made new appt for 07/30/15.

## 2015-07-30 ENCOUNTER — Ambulatory Visit (INDEPENDENT_AMBULATORY_CARE_PROVIDER_SITE_OTHER): Payer: BLUE CROSS/BLUE SHIELD | Admitting: Internal Medicine

## 2015-07-30 ENCOUNTER — Encounter: Payer: Self-pay | Admitting: Internal Medicine

## 2015-07-30 VITALS — BP 124/84 | HR 98 | Temp 97.9°F | Wt 172.0 lb

## 2015-07-30 DIAGNOSIS — F17290 Nicotine dependence, other tobacco product, uncomplicated: Secondary | ICD-10-CM

## 2015-07-30 DIAGNOSIS — B20 Human immunodeficiency virus [HIV] disease: Secondary | ICD-10-CM

## 2015-07-30 DIAGNOSIS — Z720 Tobacco use: Secondary | ICD-10-CM

## 2015-07-30 DIAGNOSIS — R739 Hyperglycemia, unspecified: Secondary | ICD-10-CM

## 2015-07-30 NOTE — Assessment & Plan Note (Signed)
Hendrick's HIV infection has come under excellent control since starting therapy a little less than one year ago. His viral load has been consistently undetectable and his CD4 count has reconstituted to normal. This is obviously good for him and also reduces the risk that he could transmit infection to his partners. Nonetheless, it is extremely important for him to always disclose his HIV status to potential partners and let them decide about what is acceptable risk. I did agree to talk to his partner. I spoke to him by phone today after Shon HaleLeon called him. Let him know that Shon HaleLeon is in care, on therapy with a suppressed viral load. I encouraged his partner to come in for free partner testing and recommended serial testing every 3 months at a minimum. I also talked to him about the potential benefit of PrEP therapy in the future, assuming he has not HIV infected. I talked Shon HaleLeon about the utmost importance of limiting the number partners he's has, careful partners collection and consistent condom use. He will continue Genvoya and follow-up with me in 6 months.

## 2015-07-30 NOTE — Assessment & Plan Note (Signed)
He continues to have mild, persistent hyperglycemia. I talked him again about the importance of managing this, especially with his family history of diabetes.

## 2015-07-30 NOTE — Assessment & Plan Note (Signed)
He continues to smoke cigars but he is trying to cut down.
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
## 2015-10-28 ENCOUNTER — Other Ambulatory Visit: Payer: Self-pay | Admitting: Internal Medicine

## 2015-10-28 DIAGNOSIS — B20 Human immunodeficiency virus [HIV] disease: Secondary | ICD-10-CM

## 2016-03-03 ENCOUNTER — Other Ambulatory Visit: Payer: Self-pay | Admitting: Internal Medicine

## 2016-03-03 DIAGNOSIS — B20 Human immunodeficiency virus [HIV] disease: Secondary | ICD-10-CM

## 2016-03-03 NOTE — Telephone Encounter (Signed)
Needs Lab and MD appointments.

## 2016-03-04 ENCOUNTER — Other Ambulatory Visit: Payer: BLUE CROSS/BLUE SHIELD

## 2016-03-04 DIAGNOSIS — B20 Human immunodeficiency virus [HIV] disease: Secondary | ICD-10-CM

## 2016-03-04 LAB — CBC
HCT: 46.8 % (ref 38.5–50.0)
Hemoglobin: 15.4 g/dL (ref 13.2–17.1)
MCH: 32.6 pg (ref 27.0–33.0)
MCHC: 32.9 g/dL (ref 32.0–36.0)
MCV: 99.2 fL (ref 80.0–100.0)
MPV: 12.7 fL — ABNORMAL HIGH (ref 7.5–12.5)
PLATELETS: 171 10*3/uL (ref 140–400)
RBC: 4.72 MIL/uL (ref 4.20–5.80)
RDW: 12.2 % (ref 11.0–15.0)
WBC: 4.2 10*3/uL (ref 3.8–10.8)

## 2016-03-04 LAB — COMPREHENSIVE METABOLIC PANEL
ALT: 15 U/L (ref 9–46)
AST: 14 U/L (ref 10–40)
Albumin: 4.5 g/dL (ref 3.6–5.1)
Alkaline Phosphatase: 65 U/L (ref 40–115)
BUN: 13 mg/dL (ref 7–25)
CHLORIDE: 106 mmol/L (ref 98–110)
CO2: 21 mmol/L (ref 20–31)
CREATININE: 1.05 mg/dL (ref 0.60–1.35)
Calcium: 9.7 mg/dL (ref 8.6–10.3)
GLUCOSE: 92 mg/dL (ref 65–99)
Potassium: 4.3 mmol/L (ref 3.5–5.3)
SODIUM: 141 mmol/L (ref 135–146)
Total Bilirubin: 0.6 mg/dL (ref 0.2–1.2)
Total Protein: 7.5 g/dL (ref 6.1–8.1)

## 2016-03-05 LAB — T-HELPER CELL (CD4) - (RCID CLINIC ONLY)
CD4 % Helper T Cell: 34 % (ref 33–55)
CD4 T CELL ABS: 700 /uL (ref 400–2700)

## 2016-03-05 LAB — HIV-1 RNA QUANT-NO REFLEX-BLD

## 2016-03-17 ENCOUNTER — Ambulatory Visit (INDEPENDENT_AMBULATORY_CARE_PROVIDER_SITE_OTHER): Payer: BLUE CROSS/BLUE SHIELD | Admitting: Internal Medicine

## 2016-03-17 ENCOUNTER — Encounter: Payer: Self-pay | Admitting: Internal Medicine

## 2016-03-17 VITALS — Ht 67.0 in | Wt 181.0 lb

## 2016-03-17 DIAGNOSIS — Z23 Encounter for immunization: Secondary | ICD-10-CM | POA: Diagnosis not present

## 2016-03-17 DIAGNOSIS — F17290 Nicotine dependence, other tobacco product, uncomplicated: Secondary | ICD-10-CM

## 2016-03-17 DIAGNOSIS — B20 Human immunodeficiency virus [HIV] disease: Secondary | ICD-10-CM | POA: Diagnosis not present

## 2016-03-17 DIAGNOSIS — R739 Hyperglycemia, unspecified: Secondary | ICD-10-CM | POA: Diagnosis not present

## 2016-03-17 NOTE — Progress Notes (Signed)
Patient Active Problem List   Diagnosis Date Noted  . HIV disease (HCC) 11/20/2013    Priority: High  . Acute HIV infection syndrome (HCC) 11/20/2013    Priority: High  . Dyslipidemia 11/21/2014  . Hyperglycemia 10/24/2014  . 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 H/O migraine 11/20/2013  . Anxiety disorder 11/20/2013  . Cigar smoker 11/20/2013    Patient's Medications  New Prescriptions   No medications on file  Previous Medications   GENVOYA 150-150-200-10 MG TABS TABLET    TAKE 1 TABLET BY MOUTH EVERY MORNING WITH BREAKFAST  Modified Medications   No medications on file  Discontinued Medications   No medications on file    Subjective: [name] [name] is in for his routine HIV follow-up visit. He denies missing any doses of his Genvoya since his last visit. At the time of his last visit he was upset because he had not disclosed his HIV status 20 partner. Fortunately that was resolved uneventfully. He is not in a relationship and has not been sexually active since that time. He did buy a house over the summer and has enjoyed fixing it up. He plans Thanksgiving dinner there this week with his family. He continues to smoke cigars and gets no regular exercise.   Review of Systems: Review of Systems  Constitutional: Negative for chills, diaphoresis, fever, malaise/fatigue and weight loss.  HENT: Negative for sore throat.   Respiratory: Negative for cough, sputum production and shortness of breath.   Cardiovascular: Negative for chest pain.  Gastrointestinal: Negative for abdominal pain, diarrhea, nausea and vomiting.  Genitourinary: Negative for dysuria and frequency.  Musculoskeletal: Negative for joint pain and myalgias.  Skin: Negative for rash.  Neurological: Negative for dizziness and headaches.  Psychiatric/Behavioral: Negative for depression and substance abuse. The patient is not nervous/anxious.     Past Medical History:  Diagnosis Date    . Anxiety   . HIV infection (HCC)   . Migraine   . Psychiatric pseudoseizure   . Shigella dysentery     Social History  Substance Use Topics  . Smoking status: Former Smoker    Types: Cigars    Quit date: 2015  . Smokeless tobacco: Never Used     Comment: 2 cigars a week  . Alcohol use 3.6 oz/week    6 Shots of liquor per week    Family History  Problem Relation Age of Onset  . Diabetes Sister   . Hypothyroidism Mother   . Diabetes Maternal Grandmother   . Diabetes Maternal Grandfather     Allergies  Allergen Reactions  . Pork-Derived Products Nausea And Vomiting    Objective:  Vitals:   03/17/16 1101  Weight: 181 lb (82.1 kg)  Height: 5\' 7"  (1.702 m)   Body mass index is 28.35 kg/m.  Physical Exam  Constitutional: He is oriented to person, place, and time.  HENT:  Mouth/Throat: No oropharyngeal exudate.  Eyes: Conjunctivae are normal.  Cardiovascular: Normal rate and regular rhythm.   No murmur heard. Pulmonary/Chest: Effort normal and breath sounds normal. He has no wheezes. He has no rales.  Abdominal: Soft. He exhibits no mass. There is no tenderness.  Musculoskeletal: Normal range of motion.  Neurological: He is alert and oriented to person, place, and time.  Skin: No rash noted.  Psychiatric: Mood and affect normal.    Lab Results Lab Results  Component Value Date   WBC 4.2 03/04/2016   HGB  15.4 03/04/2016   HCT 46.8 03/04/2016   MCV 99.2 03/04/2016   PLT 171 03/04/2016    Lab Results  Component Value Date   CREATININE 1.05 03/04/2016   BUN 13 03/04/2016   NA 141 03/04/2016   K 4.3 03/04/2016   CL 106 03/04/2016   CO2 21 03/04/2016    Lab Results  Component Value Date   ALT 15 03/04/2016   AST 14 03/04/2016   ALKPHOS 65 03/04/2016   BILITOT 0.6 03/04/2016    Lab Results  Component Value Date   CHOL 156 06/05/2015   HDL 39 (L) 06/05/2015   LDLCALC 95 06/05/2015   TRIG 111 06/05/2015   CHOLHDL 4.0 06/05/2015   HIV 1 RNA  Quant (copies/mL)  Date Value  03/04/2016 <20  06/05/2015 <20  10/18/2014 <20   CD4 T Cell Abs (/uL)  Date Value  03/04/2016 700  06/05/2015 570  10/18/2014 480     Problem List Items Addressed This Visit      High   HIV disease (HCC)    His infection remains under excellent, long-term control. He will get his influenza vaccination today. He will continue to avoid in follow-up after lab work in 6 months.        Unprioritized   Cigar smoker    I talked to him again about the importance of cessation.      Hyperglycemia    His blood sugar was normal on his most recent lab work but it has been elevated over the past year and a half on a fairly regular basis. His sister has diabetes. I encouraged him to start getting regular exercise as a way to full weight becoming prediabetic/diabetic.           [name] AstersJohn Briante Loveall, MD [facility] [phone] pager   [phone] cell 03/17/2016, 11:22 AM

## 2016-03-17 NOTE — Assessment & Plan Note (Signed)
His infection remains under excellent, long-term control. He will get his influenza vaccination today. He will continue to avoid in follow-up after lab work in 6 months.

## 2016-03-17 NOTE — Addendum Note (Signed)
Addended by: Linnell FullingBRANNON, Danell Verno N on: 03/17/2016 12:09 PM   Modules accepted: Orders, SmartSet

## 2016-03-17 NOTE — Assessment & Plan Note (Signed)
I talked to him again about the importance of cessation.

## 2016-03-17 NOTE — Assessment & Plan Note (Signed)
His blood sugar was normal on his most recent lab work but it has been elevated over the past year and a half on a fairly regular basis. His sister has diabetes. I encouraged him to start getting regular exercise as a way to full weight becoming prediabetic/diabetic.

## 2016-04-03 ENCOUNTER — Other Ambulatory Visit: Payer: Self-pay | Admitting: Internal Medicine

## 2016-04-03 DIAGNOSIS — B20 Human immunodeficiency virus [HIV] disease: Secondary | ICD-10-CM

## 2016-05-08 ENCOUNTER — Telehealth: Payer: Self-pay | Admitting: *Deleted

## 2016-05-08 NOTE — Telephone Encounter (Signed)
Patient's genvoya approved through 05/08/17. RN contacted [facility] where prescription was sent last month, left message that has been approved by insurance through the next year, asked them to call if there is an issue. Andree CossHowell, Jariel Drost M, RN

## 2016-05-19 ENCOUNTER — Other Ambulatory Visit: Payer: Self-pay | Admitting: *Deleted

## 2016-05-19 DIAGNOSIS — B20 Human immunodeficiency virus [HIV] disease: Secondary | ICD-10-CM

## 2016-05-19 MED ORDER — ELVITEG-COBIC-EMTRICIT-TENOFAF 150-150-200-10 MG PO TABS: 1.0000 | ORAL_TABLET | Freq: Every day | ORAL | 5 refills | Status: DC

## 2016-06-09 ENCOUNTER — Telehealth: Payer: Self-pay | Admitting: *Deleted

## 2016-06-09 ENCOUNTER — Other Ambulatory Visit: Payer: Self-pay | Admitting: *Deleted

## 2016-06-09 DIAGNOSIS — B20 Human immunodeficiency virus [HIV] disease: Secondary | ICD-10-CM

## 2016-06-09 NOTE — Telephone Encounter (Signed)
[facility] has [place] and approved through 05/08/17. Patient needs to use [facility]. They spoke to patient and it will ship today and patient will receive tomorrow.

## 2016-09-01 ENCOUNTER — Other Ambulatory Visit: Payer: Self-pay

## 2016-09-15 ENCOUNTER — Ambulatory Visit (INDEPENDENT_AMBULATORY_CARE_PROVIDER_SITE_OTHER): Payer: No Typology Code available for payment source | Admitting: Internal Medicine

## 2016-09-15 ENCOUNTER — Encounter: Payer: Self-pay | Admitting: Internal Medicine

## 2016-09-15 DIAGNOSIS — B20 Human immunodeficiency virus [HIV] disease: Secondary | ICD-10-CM | POA: Diagnosis not present

## 2016-09-15 LAB — COMPREHENSIVE METABOLIC PANEL
ALT: 25 U/L (ref 9–46)
AST: 15 U/L (ref 10–40)
Albumin: 4.6 g/dL (ref 3.6–5.1)
Alkaline Phosphatase: 67 U/L (ref 40–115)
BUN: 11 mg/dL (ref 7–25)
CHLORIDE: 108 mmol/L (ref 98–110)
CO2: 23 mmol/L (ref 20–31)
CREATININE: 1.09 mg/dL (ref 0.60–1.35)
Calcium: 9.7 mg/dL (ref 8.6–10.3)
GLUCOSE: 94 mg/dL (ref 65–99)
POTASSIUM: 4 mmol/L (ref 3.5–5.3)
SODIUM: 142 mmol/L (ref 135–146)
Total Bilirubin: 0.8 mg/dL (ref 0.2–1.2)
Total Protein: 7.5 g/dL (ref 6.1–8.1)

## 2016-09-15 LAB — CBC
HCT: 44.9 % (ref 38.5–50.0)
Hemoglobin: 15.2 g/dL (ref 13.2–17.1)
MCH: 33.1 pg — ABNORMAL HIGH (ref 27.0–33.0)
MCHC: 33.9 g/dL (ref 32.0–36.0)
MCV: 97.8 fL (ref 80.0–100.0)
MPV: 13.3 fL — ABNORMAL HIGH (ref 7.5–12.5)
PLATELETS: 146 10*3/uL (ref 140–400)
RBC: 4.59 MIL/uL (ref 4.20–5.80)
RDW: 12.6 % (ref 11.0–15.0)
WBC: 3.7 10*3/uL — ABNORMAL LOW (ref 3.8–10.8)

## 2016-09-15 LAB — LIPID PANEL
CHOL/HDL RATIO: 3.7 ratio (ref <5.0)
Cholesterol: 157 mg/dL (ref <200)
HDL: 43 mg/dL (ref >40)
LDL Cholesterol: 100 mg/dL — ABNORMAL HIGH (ref <100)
Triglycerides: 71 mg/dL (ref <150)
VLDL: 14 mg/dL (ref <30)

## 2016-09-15 NOTE — Assessment & Plan Note (Signed)
His infection is under excellent, long-term control since starting therapy in 2015. He will continue Genvoya and follow-up after lab work in one year.

## 2016-09-15 NOTE — Progress Notes (Signed)
Patient Active Problem List   Diagnosis Date Noted  . HIV disease (HCC) 11/20/2013    Priority: High  . Acute HIV infection syndrome (HCC) 11/20/2013    Priority: High  . Dyslipidemia 11/21/2014  . Hyperglycemia 10/24/2014  . 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 H/O migraine 11/20/2013  . Anxiety disorder 11/20/2013  . Cigar smoker 11/20/2013    Patient's Medications  New Prescriptions   No medications on file  Previous Medications   ELVITEGRAVIR-COBICISTAT-EMTRICITABINE-TENOFOVIR (GENVOYA) 150-150-200-10 MG TABS TABLET    Take 1 tablet by mouth daily with breakfast.  Modified Medications   No medications on file  Discontinued Medications   No medications on file    Subjective: [name] [name] is in for his routine HIV follow-up visit. He has had no problems obtaining, taking or tolerating his Genvoya. He takes it each morning with breakfast. He does not recall missing any doses. He is not having any problems or concerns regarding his health. He is not in a relationship and has not been sexually active.   Review of Systems: Review of Systems  Constitutional: Negative for chills, diaphoresis, fever, malaise/fatigue and weight loss.  HENT: Negative for sore throat.   Respiratory: Negative for cough, sputum production and shortness of breath.   Cardiovascular: Negative for chest pain.  Gastrointestinal: Negative for abdominal pain, diarrhea, heartburn, nausea and vomiting.  Genitourinary: Negative for dysuria and frequency.  Musculoskeletal: Negative for joint pain and myalgias.  Skin: Negative for rash.  Neurological: Negative for dizziness and headaches.  Psychiatric/Behavioral: Negative for depression and substance abuse. The patient is not nervous/anxious.     Past Medical History:  Diagnosis Date  . Anxiety   . HIV infection (HCC)   . Migraine   . Psychiatric pseudoseizure   . Shigella dysentery     Social History  Substance Use  Topics  . Smoking status: Former Smoker    Types: Cigars    Quit date: 2015  . Smokeless tobacco: Never Used     Comment: 2 cigars a week  . Alcohol use 3.6 oz/week    6 Shots of liquor per week    Family History  Problem Relation Age of Onset  . Diabetes Sister   . Hypothyroidism Mother   . Diabetes Maternal Grandmother   . Diabetes Maternal Grandfather     Allergies  Allergen Reactions  . Pork-Derived Products Nausea And Vomiting    Objective:  Vitals:   09/15/16 1013  BP: 133/77  Pulse: (!) 102  Temp: 97.7 F (36.5 C)  TempSrc: Oral  Weight: 180 lb (81.6 kg)   Body mass index is 28.19 kg/m.  Physical Exam  Constitutional: He is oriented to person, place, and time.  He is in good spirits.  HENT:  Mouth/Throat: No oropharyngeal exudate.  Eyes: Conjunctivae are normal.  Cardiovascular: Normal rate and regular rhythm.   No murmur heard. Pulmonary/Chest: Effort normal and breath sounds normal.  Abdominal: Soft. He exhibits no mass. There is no tenderness.  Musculoskeletal: Normal range of motion.  Neurological: He is alert and oriented to person, place, and time.  Skin: No rash noted.  Psychiatric: Mood and affect normal.    Lab Results Lab Results  Component Value Date   WBC 4.2 03/04/2016   HGB 15.4 03/04/2016   HCT 46.8 03/04/2016   MCV 99.2 03/04/2016   PLT 171 03/04/2016    Lab Results  Component Value Date   CREATININE 1.05  03/04/2016   BUN 13 03/04/2016   NA 141 03/04/2016   K 4.3 03/04/2016   CL 106 03/04/2016   CO2 21 03/04/2016    Lab Results  Component Value Date   ALT 15 03/04/2016   AST 14 03/04/2016   ALKPHOS 65 03/04/2016   BILITOT 0.6 03/04/2016    Lab Results  Component Value Date   CHOL 156 06/05/2015   HDL 39 (L) 06/05/2015   LDLCALC 95 06/05/2015   TRIG 111 06/05/2015   CHOLHDL 4.0 06/05/2015   HIV 1 RNA Quant (copies/mL)  Date Value  03/04/2016 <20  06/05/2015 <20  10/18/2014 <20   CD4 T Cell Abs (/uL)    Date Value  03/04/2016 700  06/05/2015 570  10/18/2014 480     Problem List Items Addressed This Visit      High   HIV disease (HCC)    His infection is under excellent, long-term control since starting therapy in 2015. He will continue Genvoya and follow-up after lab work in one year.      Relevant Orders   T-helper cell (CD4)- ([facility] clinic only)   HIV 1 RNA quant-no reflex-bld   CBC   Comprehensive metabolic panel   RPR   Lipid panel   CBC   T-helper cell (CD4)- ([facility] clinic only)   Comprehensive metabolic panel   Lipid panel   RPR   HIV 1 RNA quant-no reflex-bld        Cliffton Asters, MD [facility] for Infectious Disease [facility] [phone] pager   [phone] cell 09/15/2016, 10:37 AM

## 2016-09-16 LAB — T-HELPER CELL (CD4) - (RCID CLINIC ONLY)
CD4 % Helper T Cell: 30 % — ABNORMAL LOW (ref 33–55)
CD4 T Cell Abs: 700 /uL (ref 400–2700)

## 2016-09-16 LAB — RPR

## 2016-09-18 LAB — HIV-1 RNA QUANT-NO REFLEX-BLD
HIV 1 RNA QUANT: NOT DETECTED {copies}/mL
HIV-1 RNA QUANT, LOG: NOT DETECTED {Log_copies}/mL

## 2016-11-26 ENCOUNTER — Other Ambulatory Visit: Payer: Self-pay | Admitting: Internal Medicine

## 2016-11-26 DIAGNOSIS — B20 Human immunodeficiency virus [HIV] disease: Secondary | ICD-10-CM

## 2017-05-24 ENCOUNTER — Telehealth: Payer: Self-pay | Admitting: *Deleted

## 2017-05-24 NOTE — Telephone Encounter (Signed)
[name] [name] has been approved for 1 year, will expire 05/13/18.  Pharmacy aware.  Patient aware. Patient filled 05/15/17. Andree CossHowell, Michelle M, RN

## 2017-07-13 NOTE — Addendum Note (Signed)
Addended by: Alesia MorinPOOLE, Rad Gramling F on: 07/13/2017 12:15 PM   Modules accepted: Orders

## 2017-07-19 ENCOUNTER — Other Ambulatory Visit: Payer: No Typology Code available for payment source

## 2017-07-27 ENCOUNTER — Ambulatory Visit: Payer: No Typology Code available for payment source | Admitting: Internal Medicine

## 2017-10-01 ENCOUNTER — Telehealth: Payer: Self-pay

## 2017-10-01 NOTE — Telephone Encounter (Signed)
Called pt again just as a f/u on pharmacy's call. Pt was unable to answer the phone, but was able to leave a vm for pt to call our office back. [name] [name], [place]

## 2017-10-01 NOTE — Telephone Encounter (Signed)
[name] [name] from pt's pharmacy called today stating that she has been unable to contact pt regarding medication pickup. Pharmacist stated that when she attempts to call the number listed in her records a recorded message is given stating that the phone number has " restrictions and that the call can't be taken." Pharmacist was hoping to get a back up number or to know if pt has upcoming appt to discuss contact information. Per records pt has missed last appt with MD.   Attempted to call pt myself to see if he could contact his pharmacy, but was given the same message.  Will attempt to call mother's number to talk to pt. [name] [name], [place]

## 2017-10-25 ENCOUNTER — Other Ambulatory Visit: Payer: Self-pay | Admitting: *Deleted

## 2017-10-25 DIAGNOSIS — B20 Human immunodeficiency virus [HIV] disease: Secondary | ICD-10-CM

## 2017-10-25 MED ORDER — ELVITEG-COBIC-EMTRICIT-TENOFAF 150-150-200-10 MG PO TABS: 1.0000 | ORAL_TABLET | Freq: Every day | ORAL | 1 refills | Status: AC

## 2017-12-07 ENCOUNTER — Telehealth: Payer: Self-pay | Admitting: *Deleted

## 2017-12-07 NOTE — Telephone Encounter (Signed)
Patient left message in Triage stating he moved to [place]. Patient will need to establish care there. [place], [place] returned his call, asked him to call back.

## 2017-12-07 NOTE — Telephone Encounter (Signed)
Received refill request for Genvoya. Patient last seen 08/2016, was asked to follow up in 1 year.  He no-showed his appointment 3/25 and 07/27/17. RN reached out via pharmacy on 10/25/17, authorizing 30 days +1 refill but requested that the patient contact the office, needed to be seen for future refills. RN denied refill today, left message for patient asking him to call as soon as possible for an appointment with a provider for future refills. Andree CossHowell, Koah Chisenhall M, RN
